# Patient Record
Sex: Female | Born: 1965 | Race: Black or African American | Hispanic: No | Marital: Married | Smoking: Never smoker
Health system: Southern US, Community
[De-identification: ages and names within clinical notes are randomized; demographics above are authoritative.]

## PROBLEM LIST (undated history)

## (undated) DIAGNOSIS — I10 Essential (primary) hypertension: Secondary | ICD-10-CM

## (undated) DIAGNOSIS — F32A Depression, unspecified: Secondary | ICD-10-CM

## (undated) DIAGNOSIS — A749 Chlamydial infection, unspecified: Secondary | ICD-10-CM

## (undated) DIAGNOSIS — F419 Anxiety disorder, unspecified: Secondary | ICD-10-CM

## (undated) DIAGNOSIS — D649 Anemia, unspecified: Secondary | ICD-10-CM

## (undated) DIAGNOSIS — K589 Irritable bowel syndrome without diarrhea: Secondary | ICD-10-CM

## (undated) DIAGNOSIS — F329 Major depressive disorder, single episode, unspecified: Secondary | ICD-10-CM

## (undated) DIAGNOSIS — T7840XA Allergy, unspecified, initial encounter: Secondary | ICD-10-CM

## (undated) HISTORY — DX: Major depressive disorder, single episode, unspecified: F32.9

## (undated) HISTORY — DX: Depression, unspecified: F32.A

## (undated) HISTORY — PX: TUBAL LIGATION: SHX77

## (undated) HISTORY — DX: Allergy, unspecified, initial encounter: T78.40XA

## (undated) HISTORY — PX: TONSILLECTOMY: SUR1361

## (undated) HISTORY — DX: Anxiety disorder, unspecified: F41.9

## (undated) HISTORY — DX: Essential (primary) hypertension: I10

---

## 2005-01-06 ENCOUNTER — Emergency Department (HOSPITAL_COMMUNITY): Admission: EM | Admit: 2005-01-06 | Discharge: 2005-01-07 | Payer: Self-pay | Admitting: Emergency Medicine

## 2005-04-19 ENCOUNTER — Ambulatory Visit (HOSPITAL_COMMUNITY): Admission: RE | Admit: 2005-04-19 | Discharge: 2005-04-19 | Payer: Self-pay | Admitting: *Deleted

## 2005-06-08 ENCOUNTER — Inpatient Hospital Stay (HOSPITAL_COMMUNITY): Admission: AD | Admit: 2005-06-08 | Discharge: 2005-06-08 | Payer: Self-pay | Admitting: Family Medicine

## 2005-06-08 ENCOUNTER — Ambulatory Visit: Payer: Self-pay | Admitting: Obstetrics and Gynecology

## 2005-07-09 ENCOUNTER — Inpatient Hospital Stay (HOSPITAL_COMMUNITY): Admission: AD | Admit: 2005-07-09 | Discharge: 2005-07-09 | Payer: Self-pay | Admitting: Obstetrics and Gynecology

## 2005-09-20 ENCOUNTER — Inpatient Hospital Stay (HOSPITAL_COMMUNITY): Admission: AD | Admit: 2005-09-20 | Discharge: 2005-09-23 | Payer: Self-pay | Admitting: Obstetrics and Gynecology

## 2005-09-20 ENCOUNTER — Encounter (INDEPENDENT_AMBULATORY_CARE_PROVIDER_SITE_OTHER): Payer: Self-pay | Admitting: Specialist

## 2005-09-24 ENCOUNTER — Encounter: Admission: RE | Admit: 2005-09-24 | Discharge: 2005-10-23 | Payer: Self-pay | Admitting: Obstetrics and Gynecology

## 2005-09-25 ENCOUNTER — Inpatient Hospital Stay (HOSPITAL_COMMUNITY): Admission: AD | Admit: 2005-09-25 | Discharge: 2005-09-25 | Payer: Self-pay | Admitting: Obstetrics and Gynecology

## 2005-09-27 ENCOUNTER — Inpatient Hospital Stay (HOSPITAL_COMMUNITY): Admission: AD | Admit: 2005-09-27 | Discharge: 2005-09-27 | Payer: Self-pay | Admitting: Obstetrics and Gynecology

## 2005-10-24 ENCOUNTER — Encounter: Admission: RE | Admit: 2005-10-24 | Discharge: 2005-11-03 | Payer: Self-pay | Admitting: Obstetrics and Gynecology

## 2005-11-04 ENCOUNTER — Other Ambulatory Visit: Admission: RE | Admit: 2005-11-04 | Discharge: 2005-11-04 | Payer: Self-pay | Admitting: Obstetrics and Gynecology

## 2006-05-10 ENCOUNTER — Ambulatory Visit: Payer: Self-pay | Admitting: Psychiatry

## 2006-05-10 ENCOUNTER — Other Ambulatory Visit (HOSPITAL_COMMUNITY): Admission: RE | Admit: 2006-05-10 | Discharge: 2006-08-08 | Payer: Self-pay | Admitting: Psychiatry

## 2007-03-17 ENCOUNTER — Ambulatory Visit: Payer: Self-pay | Admitting: Psychiatry

## 2007-03-17 ENCOUNTER — Other Ambulatory Visit (HOSPITAL_COMMUNITY): Admission: RE | Admit: 2007-03-17 | Discharge: 2007-06-15 | Payer: Self-pay | Admitting: Psychiatry

## 2009-03-26 ENCOUNTER — Emergency Department (HOSPITAL_BASED_OUTPATIENT_CLINIC_OR_DEPARTMENT_OTHER): Admission: EM | Admit: 2009-03-26 | Discharge: 2009-03-27 | Payer: Self-pay | Admitting: Emergency Medicine

## 2009-03-27 ENCOUNTER — Ambulatory Visit: Payer: Self-pay | Admitting: Diagnostic Radiology

## 2009-12-04 DIAGNOSIS — G43909 Migraine, unspecified, not intractable, without status migrainosus: Secondary | ICD-10-CM | POA: Insufficient documentation

## 2010-06-30 LAB — COMPREHENSIVE METABOLIC PANEL
ALT: 19 U/L (ref 0–35)
AST: 25 U/L (ref 0–37)
Albumin: 4.2 g/dL (ref 3.5–5.2)
Alkaline Phosphatase: 128 U/L — ABNORMAL HIGH (ref 39–117)
BUN: 9 mg/dL (ref 6–23)
CO2: 28 mEq/L (ref 19–32)
Calcium: 9.2 mg/dL (ref 8.4–10.5)
Chloride: 103 mEq/L (ref 96–112)
Creatinine, Ser: 0.9 mg/dL (ref 0.4–1.2)
GFR calc Af Amer: >60 mL/min (ref >60)
GFR calc non Af Amer: >60 mL/min (ref >60)
Glucose, Bld: 88 mg/dL (ref 70–99)
Potassium: 3.7 mEq/L (ref 3.5–5.1)
Sodium: 142 mEq/L (ref 135–145)
Total Bilirubin: 0.5 mg/dL (ref 0.3–1.2)
Total Protein: 7.9 g/dL (ref 6.0–8.3)

## 2010-06-30 LAB — CBC
HCT: 34.6 % — ABNORMAL LOW (ref 36.0–46.0)
Hemoglobin: 11.7 g/dL — ABNORMAL LOW (ref 12.0–15.0)
MCHC: 33.7 g/dL (ref 30.0–36.0)
MCV: 84 fL (ref 78.0–100.0)
Platelets: ADEQUATE 10*3/uL (ref 150–400)
RBC: 4.12 MIL/uL (ref 3.87–5.11)
RDW: 18 % — ABNORMAL HIGH (ref 11.5–15.5)
WBC: 13.6 10*3/uL — ABNORMAL HIGH (ref 4.0–10.5)

## 2010-06-30 LAB — URINE CULTURE: Colony Count: 60000

## 2010-06-30 LAB — URINALYSIS, ROUTINE W REFLEX MICROSCOPIC
Bilirubin Urine: NEGATIVE
Glucose, UA: NEGATIVE mg/dL
Ketones, ur: 15 mg/dL — AB
Nitrite: NEGATIVE
Protein, ur: NEGATIVE mg/dL
Specific Gravity, Urine: 1.027 (ref 1.005–1.030)
Urobilinogen, UA: 0.2 mg/dL (ref 0.0–1.0)
pH: 5.5 (ref 5.0–8.0)

## 2010-06-30 LAB — URINE MICROSCOPIC-ADD ON

## 2010-06-30 LAB — DIFFERENTIAL
Basophils Absolute: 0.3 10*3/uL — ABNORMAL HIGH (ref 0.0–0.1)
Basophils Relative: 2 % — ABNORMAL HIGH (ref 0–1)
Eosinophils Absolute: 0.1 10*3/uL (ref 0.0–0.7)
Eosinophils Relative: 1 % (ref 0–5)
Lymphocytes Relative: 31 % (ref 12–46)
Lymphs Abs: 4.2 10*3/uL — ABNORMAL HIGH (ref 0.7–4.0)
Monocytes Absolute: 0.7 10*3/uL (ref 0.1–1.0)
Monocytes Relative: 5 % (ref 3–12)
Neutro Abs: 8.3 10*3/uL — ABNORMAL HIGH (ref 1.7–7.7)
Neutrophils Relative %: 61 % (ref 43–77)

## 2010-06-30 LAB — LIPASE, BLOOD: Lipase: 49 U/L (ref 23–300)

## 2010-06-30 LAB — PREGNANCY, URINE: Preg Test, Ur: NEGATIVE

## 2010-08-15 NOTE — Op Note (Signed)
[name], [name] NO.:  [id number]   MEDICAL RECORD NO.:  [id number]          PATIENT TYPE:  INP   LOCATION:  9128                          FACILITY:  [facility]   PHYSICIAN:  Osborn Coho, M.D.   DATE OF BIRTH:  1965/04/02   DATE OF PROCEDURE:  09/20/2005  DATE OF DISCHARGE:                                 OPERATIVE REPORT   PREOPERATIVE DIAGNOSES:  1.  Term intrauterine pregnancy.  2.  Elective repeat cesarean section.  3.  Spontaneous rupture of membranes, in labor.  4.  Desires permanent sterilization.   POSTOPERATIVE DIAGNOSES:  1.  Term intrauterine pregnancy.  2.  Elective repeat cesarean section.  3.  Spontaneous rupture of membranes, in labor.  4.  Desires permanent sterilization.   PROCEDURE:  1.  Repeat cesarean section.  2.  Bilateral tubal ligation.   ATTENDING PHYSICIAN:  Osborn Coho, M.D.   ASSISTANT:  Rica Koyanagi, C.N.M.   ANESTHESIA:  Spinal.   FLUIDS:  2600 mL.   URINE OUTPUT:  100 mL.   EBL:  500 mL.   SPECIMENS:  Placenta to pathology as well as bilateral portions of fallopian  tubes.   COMPLICATIONS:  None.   FINDINGS:  1.  Live female infant with Apgars of 9 at 1 minute and 9 at 5 minutes.  2.  Normal appearing bilateral ovaries and fallopian tubes.   PROCEDURE:  The patient was taken to the operating room after the consent  signed and witnessed.  Patient was given a spinal per Anesthesia and prepped  and draped in the normal sterile fashion.  A Pfannenstiel skin incision was  made and carried down to the underlying layer of fascia with the Bovie.  The  fascia was excised bilaterally in the midline and the incision extended  bilaterally with the Mayo scissors.  Kocher clamps were placed on the  inferior aspect of the fascial incision and the rectus muscle excised from  the fascia.  The same was done on the superior aspect of the fascial  incision.  The rectus muscle was separated in the midline and the  peritoneum  entered bluntly.  The bladder blade was placed and bladder flap created with  the Metzenbaum scissors.  A uterine incision was made with the scalpel and  extended bilaterally with the bandage scissors.  Infant was in direct  occiput posterior presentation and delivered without difficulty and oral and  nasopharynx were bulb suctioned.  Cord clamped and cut and infant handed to  the waiting pediatricians while 1g of Ancef being administered.  Cord bloods  were collected.  Placenta was removed via fundal massage and uterus cleared  of all clots and debris.  The uterus incision was repaired with #0 Vicryl in  a running locked fashion and a second imbricating layer was performed.  The  adnexa were identified bilaterally and noted to be within normal limits and  bilateral tubal ligation performed by placing a Babcock on the isthmic  portion of the right fallopian tube, which was then ligated with #0 plain  ties.  This was done twice and the  portion of the fallopian tube was excised  and the remaining pedicle cauterized with the Bovie.  The same was done on  the left fallopian tube.  Specimen sent to pathology.  Uterus had been  exteriorized for a bilateral tubal ligation and was returned to the  intraabdominal cavity.  Copious irrigation was performed and good hemostasis  of the uterine incision was noted.  The peritoneum was closed with #2-0  chromic in a running fashion and the fascia was repaired with #0 Vicryl in a  running fashion.  The subcutaneous tissue was reapproximated using #2-0  plain via three interrupteds and the skin was reapproximated using #3-0  Monocryl via a subcuticular stitch.  A pressure dressing was applied.  Sponge, lap and needle count was correct.  The patient tolerated the  procedure well and is currently awaiting transfer to the recovery room in  good condition.      Osborn Coho, M.D.  Electronically Signed     AR/MEDQ  D:  09/20/2005  T:   09/20/2005  Job:  [id number]
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
## 2011-07-08 ENCOUNTER — Ambulatory Visit (INDEPENDENT_AMBULATORY_CARE_PROVIDER_SITE_OTHER): Payer: Managed Care, Other (non HMO) | Admitting: Family Medicine

## 2011-07-08 VITALS — BP 140/80 | HR 47 | Temp 98.5°F | Resp 16 | Ht 66.0 in | Wt 226.6 lb

## 2011-07-08 DIAGNOSIS — Z0000 Encounter for general adult medical examination without abnormal findings: Secondary | ICD-10-CM

## 2011-07-08 DIAGNOSIS — IMO0001 Reserved for inherently not codable concepts without codable children: Secondary | ICD-10-CM

## 2011-07-08 DIAGNOSIS — Z23 Encounter for immunization: Secondary | ICD-10-CM

## 2011-07-08 DIAGNOSIS — H6690 Otitis media, unspecified, unspecified ear: Secondary | ICD-10-CM

## 2011-07-08 DIAGNOSIS — H9209 Otalgia, unspecified ear: Secondary | ICD-10-CM

## 2011-07-08 DIAGNOSIS — H9201 Otalgia, right ear: Secondary | ICD-10-CM

## 2011-07-08 DIAGNOSIS — R030 Elevated blood-pressure reading, without diagnosis of hypertension: Secondary | ICD-10-CM

## 2011-07-08 MED ORDER — FLUTICASONE PROPIONATE 50 MCG/ACT NA SUSP: 2.0000 | Freq: Every day | NASAL | Status: DC

## 2011-07-08 MED ORDER — KETOROLAC TROMETHAMINE 60 MG/2ML IM SOLN
60.0000 mg | Freq: Once | INTRAMUSCULAR | Status: AC
  Administered 2011-07-08: 60 mg via INTRAMUSCULAR

## 2011-07-08 MED ORDER — AMOXICILLIN 875 MG PO TABS: 875.0000 mg | ORAL_TABLET | Freq: Two times a day (BID) | ORAL | Status: AC

## 2011-07-08 NOTE — Progress Notes (Signed)
  Subjective:    Patient ID: [name], female    DOB: 02-13-1966, 46 y.o.   MRN: [id number]  HPI  Patient presents for CPE  1) Severe (R) otalgia X 1 day; no otic discharge. Significant allergies   2) Multiple elevated BP readings; strong FH of HTN  Review of Systems    See pink form Objective:   Physical Exam  Constitutional: She is oriented to person, place, and time. Vital signs are normal.  HENT:  Right Ear: Tympanic membrane is injected (bulging).  Cardiovascular: Normal rate, regular rhythm and normal pulses.   Pulmonary/Chest: Effort normal and breath sounds normal.  Abdominal: Soft. Normal appearance and bowel sounds are normal. She exhibits no mass. There is no hepatosplenomegaly.  Musculoskeletal: Normal range of motion.  Neurological: She is alert and oriented to person, place, and time. She displays normal reflexes. No cranial nerve deficit.  Skin: Skin is warm.  Psychiatric: She has a normal mood and affect.     On menses unable to obtain pap    Assessment & Plan:   1. Routine general medical examination at a health care facility  CBC with Differential, Comprehensive metabolic panel, TSH, Lipid panel, Tdap vaccine greater than or equal to 7yo IM, TB Skin Test  2. OM (otitis media), acute  amoxicillin (AMOXIL) 875 MG tablet  3. Otalgia of right ear  ketorolac (TORADOL) injection 60 mg  4. Elevated BP  Monitor BP readings  5. Allergic rhinitis  fluticasone (FLONASE) 50 MCG/ACT nasal spray

## 2011-07-08 NOTE — Progress Notes (Signed)
PPD Placement note [name], 46 y.o. female is here today for placement of PPD test Reason for PPD test: employment Pt taken PPD test before: yes Verified in allergy area and with patient that they are not allergic to the products PPD is made of (Phenol or Tween). No:  Is patient taking any oral or IV steroid medication now or have they taken it in the last month? no Has the patient ever received the BCG vaccine?: no Has the patient been in recent contact with anyone known or suspected of having active TB disease?: no      Date of exposure (if applicable):       Name of person they were exposed to (if applicable):  Patient's Country of origin?:  P:  PPD placed on 07/08/2011.  Patient advised to return for reading within 48-72 hours.

## 2011-07-10 ENCOUNTER — Ambulatory Visit (INDEPENDENT_AMBULATORY_CARE_PROVIDER_SITE_OTHER): Payer: Managed Care, Other (non HMO) | Admitting: *Deleted

## 2011-07-10 DIAGNOSIS — Z111 Encounter for screening for respiratory tuberculosis: Secondary | ICD-10-CM

## 2011-07-10 LAB — TB SKIN TEST: Induration: 0

## 2011-07-16 ENCOUNTER — Ambulatory Visit (INDEPENDENT_AMBULATORY_CARE_PROVIDER_SITE_OTHER): Payer: Managed Care, Other (non HMO) | Admitting: Family Medicine

## 2011-07-16 VITALS — BP 139/83 | HR 45 | Temp 98.1°F | Resp 14 | Ht 66.0 in | Wt 222.6 lb

## 2011-07-16 DIAGNOSIS — H6980 Other specified disorders of Eustachian tube, unspecified ear: Secondary | ICD-10-CM

## 2011-07-16 DIAGNOSIS — I200 Unstable angina: Secondary | ICD-10-CM

## 2011-07-16 MED ORDER — PREDNISONE 20 MG PO TABS: 20.0000 mg | ORAL_TABLET | Freq: Every day | ORAL | Status: AC

## 2011-07-16 NOTE — Progress Notes (Signed)
  Subjective:    Patient ID: [name], female    DOB: [birth year], 46 y.o.   MRN: [id number]  HPI Recent (R) OM- antibiotic day # 8(Amoxil) Otalgia has resolved however ear still feels clogged and hearing is reduced  Non smoker  Leaving for [facility] today.  Review of Systems     Objective:   Physical Exam  Constitutional: She appears well-developed.  HENT:  Right Ear: External ear and ear canal normal. Tympanic membrane is retracted.  Left Ear: External ear and ear canal normal.  Nose: Mucosal edema (swollen turbinates with purlple hue) present.  Neck: Neck supple.  Cardiovascular: Normal rate, regular rhythm and normal heart sounds.   Pulmonary/Chest: Effort normal and breath sounds normal.  Neurological: She is alert.  Skin: Skin is warm.       Assessment & Plan:   1. ETD (eustachian tube dysfunction)  predniSONE (DELTASONE) 20 MG tablet  Declines flonase Anticipatory guidance

## 2011-08-18 ENCOUNTER — Ambulatory Visit: Payer: Managed Care, Other (non HMO)

## 2011-08-18 ENCOUNTER — Ambulatory Visit (INDEPENDENT_AMBULATORY_CARE_PROVIDER_SITE_OTHER): Payer: Managed Care, Other (non HMO) | Admitting: Internal Medicine

## 2011-08-18 VITALS — BP 120/80 | HR 56 | Temp 98.0°F | Resp 16 | Ht 67.0 in | Wt 224.0 lb

## 2011-08-18 DIAGNOSIS — R05 Cough: Secondary | ICD-10-CM

## 2011-08-18 DIAGNOSIS — R5383 Other fatigue: Secondary | ICD-10-CM

## 2011-08-18 DIAGNOSIS — J209 Acute bronchitis, unspecified: Secondary | ICD-10-CM

## 2011-08-18 LAB — POCT CBC
Granulocyte percent: 61.2 %G (ref 37–80)
HCT, POC: 40.5 % (ref 37.7–47.9)
Hemoglobin: 12.7 g/dL (ref 12.2–16.2)
Lymph, poc: 4.2 — AB (ref 0.6–3.4)
MCH, POC: 28.3 pg (ref 27–31.2)
MCHC: 31.4 g/dL — AB (ref 31.8–35.4)
MCV: 90.3 fL (ref 80–97)
MID (cbc): 0.7 (ref 0–0.9)
MPV: 9.2 fL (ref 0–99.8)
POC Granulocyte: 7.7 — AB (ref 2–6.9)
POC LYMPH PERCENT: 33 %L (ref 10–50)
POC MID %: 5.8 %M (ref 0–12)
Platelet Count, POC: 380 10*3/uL (ref 142–424)
RDW, POC: 14.7 %
WBC: 12.6 10*3/uL — AB (ref 4.6–10.2)

## 2011-08-18 MED ORDER — HYDROCODONE-ACETAMINOPHEN 7.5-500 MG/15ML PO SOLN: 5.0000 mL | Freq: Four times a day (QID) | ORAL | Status: AC | PRN

## 2011-08-18 MED ORDER — AZITHROMYCIN 500 MG PO TABS: 500.0000 mg | ORAL_TABLET | Freq: Every day | ORAL | Status: AC

## 2011-08-18 NOTE — Patient Instructions (Signed)

## 2011-08-18 NOTE — Progress Notes (Signed)
  Subjective:    Patient ID: [name], female    DOB: 29-Oct-1965, 46 y.o.   MRN: [id number]  HPI Has fatigue, allergys, and cough. No fever, but hot and chills and sweats. No SOB, CP.   Review of Systems allergys    Objective:   Physical Exam  Constitutional: She is oriented to person, place, and time. She appears well-nourished. No distress.  HENT:  Right Ear: External ear normal.  Left Ear: External ear normal.  Nose: Mucosal edema, rhinorrhea and sinus tenderness present. Right sinus exhibits frontal sinus tenderness. Left sinus exhibits maxillary sinus tenderness and frontal sinus tenderness.  Mouth/Throat: Oropharynx is clear and moist.  Eyes: EOM are normal.  Neck: Neck supple.  Cardiovascular: Normal rate, regular rhythm and normal heart sounds.   Pulmonary/Chest: No respiratory distress. She has no decreased breath sounds. She has no wheezes. She has rhonchi. She has rales.         Rales at x  Neurological: She is alert and oriented to person, place, and time. Coordination normal.  Skin: Skin is warm and dry.  Psychiatric: She has a normal mood and affect.   [facility] reading (PRIMARY) by  Dr.Lakira Ogando cxr clear  CBC Results for orders placed in visit on 08/18/11  POCT CBC      Component Value Range   WBC 12.6 (*) 4.6 - 10.2 (K/uL)   Lymph, poc 4.2 (*) 0.6 - 3.4    POC LYMPH PERCENT 33.0  10 - 50 (%L)   MID (cbc) 0.7  0 - 0.9    POC MID % 5.8  0 - 12 (%M)   POC Granulocyte 7.7 (*) 2 - 6.9    Granulocyte percent 61.2  37 - 80 (%G)   RBC 4.48  4.04 - 5.48 (M/uL)   Hemoglobin 12.7  12.2 - 16.2 (g/dL)   HCT, POC 40.9  81.1 - 47.9 (%)   MCV 90.3  80 - 97 (fL)   MCH, POC 28.3  27 - 31.2 (pg)   MCHC 31.4 (*) 31.8 - 35.4 (g/dL)   RDW, POC 91.4     Platelet Count, POC 380  142 - 424 (K/uL)   MPV 9.2  0 - 99.8 (fL)         Assessment & Plan:  Bronchitis over 2 weeks Zithromax and lortab elixir

## 2012-07-11 ENCOUNTER — Inpatient Hospital Stay (HOSPITAL_COMMUNITY)
Admission: AD | Admit: 2012-07-11 | Discharge: 2012-07-11 | Disposition: A | Discharge status: Home / Self Care | Payer: 59 | Source: Ambulatory Visit | Attending: Obstetrics & Gynecology | Admitting: Obstetrics & Gynecology

## 2012-07-11 ENCOUNTER — Inpatient Hospital Stay (HOSPITAL_COMMUNITY): Payer: 59

## 2012-07-11 ENCOUNTER — Encounter (HOSPITAL_COMMUNITY): Payer: Self-pay | Admitting: *Deleted

## 2012-07-11 DIAGNOSIS — N949 Unspecified condition associated with female genital organs and menstrual cycle: Secondary | ICD-10-CM | POA: Insufficient documentation

## 2012-07-11 DIAGNOSIS — N938 Other specified abnormal uterine and vaginal bleeding: Secondary | ICD-10-CM | POA: Insufficient documentation

## 2012-07-11 DIAGNOSIS — R109 Unspecified abdominal pain: Secondary | ICD-10-CM | POA: Insufficient documentation

## 2012-07-11 DIAGNOSIS — A5901 Trichomonal vulvovaginitis: Secondary | ICD-10-CM | POA: Insufficient documentation

## 2012-07-11 HISTORY — DX: Chlamydial infection, unspecified: A74.9

## 2012-07-11 HISTORY — DX: Anemia, unspecified: D64.9

## 2012-07-11 HISTORY — DX: Irritable bowel syndrome without diarrhea: K58.9

## 2012-07-11 LAB — CBC
HCT: 33.8 % — ABNORMAL LOW (ref 36.0–46.0)
MCH: 29.7 pg (ref 26.0–34.0)
MCHC: 33.4 g/dL (ref 30.0–36.0)
MCV: 88.9 fL (ref 78.0–100.0)
Platelets: 283 10*3/uL (ref 150–400)
RBC: 3.8 MIL/uL — ABNORMAL LOW (ref 3.87–5.11)
WBC: 10.6 10*3/uL — ABNORMAL HIGH (ref 4.0–10.5)

## 2012-07-11 LAB — URINALYSIS, ROUTINE W REFLEX MICROSCOPIC
Bilirubin Urine: NEGATIVE
Ketones, ur: NEGATIVE mg/dL
Leukocytes, UA: NEGATIVE
Nitrite: NEGATIVE
Protein, ur: NEGATIVE mg/dL
Urobilinogen, UA: 0.2 mg/dL (ref 0.0–1.0)
pH: 5.5 (ref 5.0–8.0)

## 2012-07-11 LAB — WET PREP, GENITAL
WBC, Wet Prep HPF POC: NONE SEEN
Yeast Wet Prep HPF POC: NONE SEEN

## 2012-07-11 LAB — URINE MICROSCOPIC-ADD ON

## 2012-07-11 MED ORDER — METRONIDAZOLE 500 MG PO TABS
2000.0000 mg | ORAL_TABLET | Freq: Once | ORAL | Status: AC
  Administered 2012-07-11: 2000 mg via ORAL
  Filled 2012-07-11: qty 4

## 2012-07-11 NOTE — MAU Note (Signed)
Had period January 26, no period again until end of March - was regular, is bleeding again today.  Lower abd pain since January, nauseated @ times.

## 2012-07-11 NOTE — MAU Provider Note (Signed)
History     CSN: [id number]  Arrival date and time: 07/11/12 1735   None     Chief Complaint  Patient presents with  . Abdominal Pain  . Vaginal Bleeding   HPI 47 y.o. [id number] with irregular bleeding since December. Had period at end of December, no period January or February, had a period at end of March which was normal for her, then started spotting again today. Intermittent low abd pain since January, sometimes on right side, sometimes in rectum, random, not associated with bleeding.    Past Medical History  Diagnosis Date  . Anemia   . Chlamydia   . Irritable bowel syndrome (IBS)     Past Surgical History  Procedure Laterality Date  . Cesarean section    . Tonsillectomy    . Tubal ligation      History reviewed. No pertinent family history.  History  Substance Use Topics  . Smoking status: Never Smoker   . Smokeless tobacco: Not on file  . Alcohol Use: Yes     Comment: social alcohol    Allergies: No Known Allergies  Prescriptions prior to admission  Medication Sig Dispense Refill  . fluticasone (FLONASE) 50 MCG/ACT nasal spray Place 2 sprays into the nose daily.  1 g  6  . [DISCONTINUED] acetaminophen-codeine (TYLENOL #3) 300-30 MG per tablet Take 1 tablet by mouth every 4 (four) hours as needed.        Review of Systems  Constitutional: Negative.  Negative for fever.  Respiratory: Negative.   Cardiovascular: Negative.   Gastrointestinal: Positive for abdominal pain. Negative for nausea, vomiting, diarrhea and constipation.  Genitourinary: Negative for dysuria, urgency, frequency, hematuria and flank pain.       Positive for bleeding   Musculoskeletal: Negative.   Neurological: Negative.   Psychiatric/Behavioral: Negative.    Physical Exam   Blood pressure 143/68, pulse 58, temperature 98.8 F (37.1 C), temperature source Oral, resp. rate 18, last menstrual period 07/11/2012.  Physical Exam  Nursing note and vitals reviewed. Constitutional:  She is oriented to person, place, and time. She appears well-developed and well-nourished. No distress.  Cardiovascular: Normal rate.   Respiratory: Effort normal.  GI: Soft. There is no tenderness.  Genitourinary: Uterus is not tender. Cervix exhibits friability. Cervix exhibits no motion tenderness and no discharge. Right adnexum displays tenderness. Left adnexum displays no tenderness. There is bleeding (scant) around the vagina. Vaginal discharge (clear, malodorous) found.    Exam limited by body habitus   Musculoskeletal: Normal range of motion.  Neurological: She is alert and oriented to person, place, and time.  Skin: Skin is warm and dry.  Psychiatric: She has a normal mood and affect.    MAU Course  Procedures Results for orders placed during the hospital encounter of 07/11/12 (from the past 24 hour(s))  URINALYSIS, ROUTINE W REFLEX MICROSCOPIC     Status: Abnormal   Collection Time    07/11/12  5:45 PM      Result Value Range   Color, Urine YELLOW  YELLOW   APPearance CLEAR  CLEAR   Specific Gravity, Urine >1.030 (*) 1.005 - 1.030   pH 5.5  5.0 - 8.0   Glucose, UA NEGATIVE  NEGATIVE mg/dL   Hgb urine dipstick LARGE (*) NEGATIVE   Bilirubin Urine NEGATIVE  NEGATIVE   Ketones, ur NEGATIVE  NEGATIVE mg/dL   Protein, ur NEGATIVE  NEGATIVE mg/dL   Urobilinogen, UA 0.2  0.0 - 1.0 mg/dL   Nitrite NEGATIVE  NEGATIVE   Leukocytes, UA NEGATIVE  NEGATIVE  URINE MICROSCOPIC-ADD ON     Status: Abnormal   Collection Time    07/11/12  5:45 PM      Result Value Range   Squamous Epithelial / LPF FEW (*) RARE   WBC, UA 0-2  <3 WBC/hpf   RBC / HPF 11-20  <3 RBC/hpf   Bacteria, UA MANY (*) RARE   Urine-Other FEW YEAST    POCT PREGNANCY, URINE     Status: None   Collection Time    07/11/12  5:48 PM      Result Value Range   Preg Test, Ur NEGATIVE  NEGATIVE  CBC     Status: Abnormal   Collection Time    07/11/12  6:01 PM      Result Value Range   WBC 10.6 (*) 4.0 - 10.5 K/uL     RBC 3.80 (*) 3.87 - 5.11 MIL/uL   Hemoglobin 11.3 (*) 12.0 - 15.0 g/dL   HCT 40.9 (*) 81.1 - 91.4 %   MCV 88.9  78.0 - 100.0 fL   MCH 29.7  26.0 - 34.0 pg   MCHC 33.4  30.0 - 36.0 g/dL   RDW 78.2  95.6 - 21.3 %   Platelets 283  150 - 400 K/uL  WET PREP, GENITAL     Status: Abnormal   Collection Time    07/11/12  6:16 PM      Result Value Range   Yeast Wet Prep HPF POC NONE SEEN  NONE SEEN   Trich, Wet Prep FEW (*) NONE SEEN   Clue Cells Wet Prep HPF POC FEW (*) NONE SEEN   WBC, Wet Prep HPF POC NONE SEEN  NONE SEEN    US Transvaginal Non-ob  07/11/2012  *RADIOLOGY REPORT*  Clinical Data: Pelvic pain and right lower quadrant tenderness. Irregular menses.  TRANSABDOMINAL AND TRANSVAGINAL ULTRASOUND OF PELVIS Technique:  Both transabdominal and transvaginal ultrasound examinations of the pelvis were performed. Transabdominal technique was performed for global imaging of the pelvis including uterus, ovaries, adnexal regions, and pelvic cul-de-sac.  It was necessary to proceed with endovaginal exam following the transabdominal exam to visualize the uterus, endometrium, and ovaries.  Comparison:  CT scan of the abdomen and pelvis dated 03/27/2009  Findings:  Uterus: 7.9 x 4.9 x 6.2 cm.  1.4 cm fibroid in the posterior aspect of the body of the uterus.  1.3 cm fibroid in the fundus of the uterus.  Endometrium: 6 mm in thickness.  Normal.  Right ovary:  Normal.  1.8 x 1.1 x 2.0 cm.  Left ovary: 3.0 x 1.4 x 1.8 cm.  8 mm simple exophytic cyst.  Other findings: No free fluid  IMPRESSION: Two tiny fibroids in the uterus and a tiny simple cyst on the left ovary.  Otherwise, normal.   Original Report Authenticated By: Francene Boyers, M.D.    US Pelvis Complete  07/11/2012  *RADIOLOGY REPORT*  Clinical Data: Pelvic pain and right lower quadrant tenderness. Irregular menses.  TRANSABDOMINAL AND TRANSVAGINAL ULTRASOUND OF PELVIS Technique:  Both transabdominal and transvaginal ultrasound examinations of the  pelvis were performed. Transabdominal technique was performed for global imaging of the pelvis including uterus, ovaries, adnexal regions, and pelvic cul-de-sac.  It was necessary to proceed with endovaginal exam following the transabdominal exam to visualize the uterus, endometrium, and ovaries.  Comparison:  CT scan of the abdomen and pelvis dated 03/27/2009  Findings:  Uterus: 7.9 x 4.9 x 6.2 cm.  1.4 cm fibroid in the posterior aspect of the body of the uterus.  1.3 cm fibroid in the fundus of the uterus.  Endometrium: 6 mm in thickness.  Normal.  Right ovary:  Normal.  1.8 x 1.1 x 2.0 cm.  Left ovary: 3.0 x 1.4 x 1.8 cm.  8 mm simple exophytic cyst.  Other findings: No free fluid  IMPRESSION: Two tiny fibroids in the uterus and a tiny simple cyst on the left ovary.  Otherwise, normal.   Original Report Authenticated By: Francene Boyers, M.D.    Assessment and Plan  1. Trichomoniasis - given flagyl 2000 mg PO in MAU, rev'd partner treatment 2. DUB/pelvic pain - very small fibroids and left ovarian cyst on u/s, not likely be to cause of bleeding or pain, no heavy bleeding at this time, f/u in GYN clinic   [facility] 07/11/2012, 8:14 PM

## 2012-07-13 NOTE — MAU Provider Note (Signed)
Attestation of Attending Supervision of Advanced Practitioner (CNM/NP): Evaluation and management procedures were performed by the Advanced Practitioner under my supervision and collaboration. I have reviewed the Advanced Practitioner's note and chart, and I agree with the management and plan.  Harles Evetts H. 9:46 AM   

## 2012-08-08 ENCOUNTER — Encounter: Payer: 59 | Admitting: Obstetrics & Gynecology

## 2012-09-03 DIAGNOSIS — N840 Polyp of corpus uteri: Secondary | ICD-10-CM | POA: Insufficient documentation

## 2012-10-30 ENCOUNTER — Emergency Department (HOSPITAL_COMMUNITY)
Admission: EM | Admit: 2012-10-30 | Discharge: 2012-10-30 | Disposition: A | Discharge status: Home / Self Care | Payer: 59 | Attending: Emergency Medicine | Admitting: Emergency Medicine

## 2012-10-30 ENCOUNTER — Emergency Department (HOSPITAL_COMMUNITY): Payer: 59

## 2012-10-30 ENCOUNTER — Encounter (HOSPITAL_COMMUNITY): Payer: Self-pay

## 2012-10-30 DIAGNOSIS — R0789 Other chest pain: Secondary | ICD-10-CM

## 2012-10-30 DIAGNOSIS — Z8619 Personal history of other infectious and parasitic diseases: Secondary | ICD-10-CM | POA: Insufficient documentation

## 2012-10-30 DIAGNOSIS — M549 Dorsalgia, unspecified: Secondary | ICD-10-CM | POA: Insufficient documentation

## 2012-10-30 DIAGNOSIS — Z862 Personal history of diseases of the blood and blood-forming organs and certain disorders involving the immune mechanism: Secondary | ICD-10-CM | POA: Insufficient documentation

## 2012-10-30 DIAGNOSIS — R079 Chest pain, unspecified: Secondary | ICD-10-CM

## 2012-10-30 DIAGNOSIS — Z8719 Personal history of other diseases of the digestive system: Secondary | ICD-10-CM | POA: Insufficient documentation

## 2012-10-30 DIAGNOSIS — R071 Chest pain on breathing: Secondary | ICD-10-CM | POA: Insufficient documentation

## 2012-10-30 LAB — COMPREHENSIVE METABOLIC PANEL
Alkaline Phosphatase: 99 U/L (ref 39–117)
BUN: 7 mg/dL (ref 6–23)
Calcium: 8.6 mg/dL (ref 8.4–10.5)
Creatinine, Ser: 0.78 mg/dL (ref 0.50–1.10)
GFR calc Af Amer: >90 mL/min (ref >90)
Glucose, Bld: 98 mg/dL (ref 70–99)
Potassium: 4 mEq/L (ref 3.5–5.1)
Total Protein: 6.6 g/dL (ref 6.0–8.3)

## 2012-10-30 LAB — CBC
HCT: 34.7 % — ABNORMAL LOW (ref 36.0–46.0)
Hemoglobin: 12 g/dL (ref 12.0–15.0)
MCH: 30.2 pg (ref 26.0–34.0)
MCHC: 34.6 g/dL (ref 30.0–36.0)
MCV: 87.4 fL (ref 78.0–100.0)

## 2012-10-30 LAB — TROPONIN I: Troponin I: <0.3 ng/mL (ref <0.30)

## 2012-10-30 LAB — LIPASE, BLOOD: Lipase: 20 U/L (ref 11–59)

## 2012-10-30 MED ORDER — TRAMADOL HCL 50 MG PO TABS: 50.0000 mg | ORAL_TABLET | Freq: Four times a day (QID) | ORAL | Status: DC | PRN

## 2012-10-30 MED ORDER — GI COCKTAIL ~~LOC~~
30.0000 mL | Freq: Once | ORAL | Status: AC
  Administered 2012-10-30: 30 mL via ORAL
  Filled 2012-10-30: qty 30

## 2012-10-30 MED ORDER — HYDROCODONE-ACETAMINOPHEN 5-325 MG PO TABS
2.0000 | ORAL_TABLET | Freq: Once | ORAL | Status: AC
  Administered 2012-10-30: 2 via ORAL
  Filled 2012-10-30: qty 2

## 2012-10-30 MED ORDER — FAMOTIDINE 20 MG PO TABS
20.0000 mg | ORAL_TABLET | Freq: Once | ORAL | Status: AC
Start: 2012-10-30 — End: 2012-10-30
  Administered 2012-10-30: 20 mg via ORAL
  Filled 2012-10-30: qty 1

## 2012-10-30 NOTE — ED Notes (Signed)
Pt complains of back pain and chest pain, hx of same and was told her breast were too big

## 2012-10-30 NOTE — ED Notes (Signed)
PT ambulated with baseline gait; VSS; A&Ox3; no signs of distress; respirations even and unlabored; skin warm and dry; no questions upon discharge.  

## 2012-10-30 NOTE — ED Notes (Signed)
C/o left shoulder blade area pain since yesterday morning then epigastric pain started in the afternoon. Reports pain worse with deep breaths, mvmt & palpation. Denies n/v, diaphoresis, cold, cough, fever, chills. C/o SOB due to pain with deep breaths.

## 2012-10-30 NOTE — ED Provider Notes (Signed)
CSN: [id number]     Arrival date & time 10/30/12  0830 History     First MD Initiated Contact with Patient 10/30/12 [phone]     Chief Complaint  Patient presents with  . Chest Pain  . Back Pain   (Consider location/radiation/quality/duration/timing/severity/associated sxs/prior Treatment) Patient is a 47 y.o. female presenting with chest pain and back pain. The history is provided by the patient.  Chest Pain Associated symptoms: back pain   Associated symptoms: no abdominal pain, no cough, no fever, no headache, no nausea, no palpitations, no shortness of breath and not vomiting   Back Pain Associated symptoms: chest pain   Associated symptoms: no abdominal pain, no fever and no headaches   pt c/o pain mid to left back and chest onset yesterday.  Onset at rest. Pain constant, dull, moderate. Worse w certain movements, change of position, turning torso, palpation back/chest. No change whether upright or supine. No relation to activity or exertion. No associated sob, nv or diaphoresis. Denies unusual fatigue or doe. No other recent similar cp or discomfort. Denies recent chest or back strain. No trauma or fall. Pt denies personal hx chest/heart disease. No family hx heart disease. No cocaine use. Non smoker. No recent immobility, trauma, travel or surgery. No pleuritic pain. No leg pain or swelling. No hx dvt or pe. No hx gerd. Denies hx gallstones. No abd pain.      Past Medical History  Diagnosis Date  . Anemia   . Chlamydia   . Irritable bowel syndrome (IBS)    Past Surgical History  Procedure Laterality Date  . Cesarean section    . Tonsillectomy    . Tubal ligation     No family history on file. History  Substance Use Topics  . Smoking status: Never Smoker   . Smokeless tobacco: Not on file  . Alcohol Use: Yes     Comment: social alcohol   OB History   Grav Para Term Preterm Abortions TAB SAB Ect Mult Living   2 2 2       2      Review of Systems  Constitutional:  Negative for fever and chills.  HENT: Negative for neck pain.   Eyes: Negative for redness.  Respiratory: Negative for cough and shortness of breath.   Cardiovascular: Positive for chest pain. Negative for palpitations and leg swelling.  Gastrointestinal: Negative for nausea, vomiting and abdominal pain.  Genitourinary: Negative for flank pain.  Musculoskeletal: Positive for back pain.  Skin: Negative for rash.  Neurological: Negative for headaches.  Hematological: Does not bruise/bleed easily.  Psychiatric/Behavioral: Negative for confusion.    Allergies  Review of patient's allergies indicates no known allergies.  Home Medications   Current Outpatient Rx  Name  Route  Sig  Dispense  Refill  . EXPIRED: fluticasone (FLONASE) 50 MCG/ACT nasal spray   Nasal   Place 2 sprays into the nose daily.   1 g   6    BP 157/83  Pulse 62  Temp(Src) 98.2 F (36.8 C) (Oral)  Resp 20  Ht 5\' 7"  (1.702 m)  Wt 233 lb (105.688 kg)  BMI 36.48 kg/m2  SpO2 98% Physical Exam  Nursing note and vitals reviewed. Constitutional: She appears well-developed and well-nourished. No distress.  Eyes: Conjunctivae are normal. No scleral icterus.  Neck: Neck supple. No tracheal deviation present.  Cardiovascular: Normal rate, regular rhythm, normal heart sounds and intact distal pulses.  Exam reveals no gallop and no friction rub.   No  murmur heard. Pulmonary/Chest: Effort normal and breath sounds normal. No respiratory distress. She exhibits tenderness.  Abdominal: Soft. Normal appearance and bowel sounds are normal. She exhibits no distension and no mass. There is no tenderness. There is no rebound and no guarding.  obese  Genitourinary:  No cva tenderness  Musculoskeletal: She exhibits no edema.  Tl spine non tender, aligned no step off.  Right thoracic muscular tenderness.   Neurological: She is alert.  Skin: Skin is warm and dry. No rash noted.  No shingles/rash in area of pain  Psychiatric:  She has a normal mood and affect.    ED Course   Procedures (including critical care time)  Results for orders placed during the hospital encounter of 10/30/12  CBC      Result Value Range   WBC 9.9  4.0 - 10.5 K/uL   RBC 3.97  3.87 - 5.11 MIL/uL   Hemoglobin 12.0  12.0 - 15.0 g/dL   HCT 16.1 (*) 09.6 - 04.5 %   MCV 87.4  78.0 - 100.0 fL   MCH 30.2  26.0 - 34.0 pg   MCHC 34.6  30.0 - 36.0 g/dL   RDW 40.9  81.1 - 91.4 %   Platelets 279  150 - 400 K/uL  COMPREHENSIVE METABOLIC PANEL      Result Value Range   Sodium 137  135 - 145 mEq/L   Potassium 4.0  3.5 - 5.1 mEq/L   Chloride 103  96 - 112 mEq/L   CO2 25  19 - 32 mEq/L   Glucose, Bld 98  70 - 99 mg/dL   BUN 7  6 - 23 mg/dL   Creatinine, Ser 7.82  0.50 - 1.10 mg/dL   Calcium 8.6  8.4 - 95.6 mg/dL   Total Protein 6.6  6.0 - 8.3 g/dL   Albumin 3.1 (*) 3.5 - 5.2 g/dL   AST 15  0 - 37 U/L   ALT 11  0 - 35 U/L   Alkaline Phosphatase 99  39 - 117 U/L   Total Bilirubin 0.2 (*) 0.3 - 1.2 mg/dL   GFR calc non Af Amer >90  >90 mL/min   GFR calc Af Amer >90  >90 mL/min  LIPASE, BLOOD      Result Value Range   Lipase 20  11 - 59 U/L  TROPONIN I      Result Value Range   Troponin I <0.30  <0.30 ng/mL   Dg Chest 2 View  10/30/2012   *RADIOLOGY REPORT*  Clinical Data: Chest pain  CHEST - 2 VIEW  Comparison: Prior chest x-ray 08/18/2011  Findings: The lungs are well-aerated and free from pulmonary edema, focal airspace consolidation or pulmonary nodule.  Cardiac and mediastinal contours are within normal limits.  No pneumothorax, or pleural effusion. No acute osseous findings.  IMPRESSION:  No acute cardiopulmonary disease.   Original Report Authenticated By: Malachy Moan, M.D.      MDM  pts pain appears worse w movement, change in position, palpation back/chest wall.  vicodin po.  Reviewed nursing notes and prior charts for additional history.    Date: 10/30/2012  Rate: 54  Rhythm: sinus bradycardia  QRS Axis: normal   Intervals: normal  ST/T Wave abnormalities: nonspecific T wave changes  Conduction Disutrbances:none  Narrative Interpretation:   Old EKG Reviewed: none available  After symptoms present/constant, x 1 days time, trop neg.  Pt symptoms appear c/w musculoskeletal pain, and not consistent w ACS.  Recheck hr 64,  rr 16, pulse ox 99% ra, comfortable.   Pt appears stable for d/c.      Suzi Roots, MD 10/30/12 1017

## 2012-10-30 NOTE — ED Notes (Signed)
Patient transported to X-ray 

## 2014-01-29 ENCOUNTER — Encounter (HOSPITAL_COMMUNITY): Payer: Self-pay

## 2014-10-26 ENCOUNTER — Ambulatory Visit (INDEPENDENT_AMBULATORY_CARE_PROVIDER_SITE_OTHER): Payer: 59 | Admitting: Family Medicine

## 2014-10-26 VITALS — BP 138/88 | HR 56 | Temp 98.5°F | Resp 18 | Ht 66.0 in | Wt 233.8 lb

## 2014-10-26 DIAGNOSIS — R001 Bradycardia, unspecified: Secondary | ICD-10-CM

## 2014-10-26 DIAGNOSIS — R079 Chest pain, unspecified: Secondary | ICD-10-CM | POA: Diagnosis not present

## 2014-10-26 DIAGNOSIS — R42 Dizziness and giddiness: Secondary | ICD-10-CM | POA: Diagnosis not present

## 2014-10-26 DIAGNOSIS — R002 Palpitations: Secondary | ICD-10-CM

## 2014-10-26 LAB — POCT CBC
Granulocyte percent: 49 %G (ref 37–80)
HEMATOCRIT: 38.1 % (ref 37.7–47.9)
Hemoglobin: 12.3 g/dL (ref 12.2–16.2)
LYMPH, POC: 3.9 — AB (ref 0.6–3.4)
MCH, POC: 28.5 pg (ref 27–31.2)
MCHC: 32.4 g/dL (ref 31.8–35.4)
MCV: 88.1 fL (ref 80–97)
MID (CBC): 0.5 (ref 0–0.9)
MPV: 7.4 fL (ref 0–99.8)
POC Granulocyte: 4.3 (ref 2–6.9)
POC LYMPH %: 44.8 % (ref 10–50)
POC MID %: 6.2 % (ref 0–12)
Platelet Count, POC: 273 10*3/uL (ref 142–424)
RBC: 4.33 M/uL (ref 4.04–5.48)
RDW, POC: 15.6 %
WBC: 8.8 10*3/uL (ref 4.6–10.2)

## 2014-10-26 LAB — COMPLETE METABOLIC PANEL WITH GFR
ALT: 12 U/L (ref 6–29)
AST: 15 U/L (ref 10–35)
Albumin: 3.7 g/dL (ref 3.6–5.1)
Alkaline Phosphatase: 104 U/L (ref 33–115)
BILIRUBIN TOTAL: 0.4 mg/dL (ref 0.2–1.2)
BUN: 13 mg/dL (ref 7–25)
CALCIUM: 9.3 mg/dL (ref 8.6–10.2)
CO2: 29 mmol/L (ref 20–31)
Chloride: 106 mmol/L (ref 98–110)
Creat: 0.89 mg/dL (ref 0.50–1.10)
GFR, Est African American: 89 mL/min (ref >=60)
GFR, Est Non African American: 77 mL/min (ref >=60)
GLUCOSE: 90 mg/dL (ref 65–99)
Potassium: 4.7 mmol/L (ref 3.5–5.3)
Sodium: 141 mmol/L (ref 135–146)
Total Protein: 7 g/dL (ref 6.1–8.1)

## 2014-10-26 LAB — TSH: TSH: 1.101 u[IU]/mL (ref 0.350–4.500)

## 2014-10-26 LAB — TROPONIN I

## 2014-10-26 NOTE — Patient Instructions (Signed)
We will take a stat troponin just to be sure that you are not having any acute cardiac process.  We will contact you with those results, sooner if it is positive.   I would like you to start taking a baby aspirin.   We are referring you to a cardiologist, so please await this phone call. Please discontinue all use of these diet pills.  They contain too much caffeine, and can likely participate in your symptoms.

## 2014-10-26 NOTE — Progress Notes (Signed)
[facility] [address] [place] [address] [phone]  Date:  10/26/2014   Name:  [name]   DOB:  05-Nov-1965   MRN:  [id number]  PCP:  No PCP Per Patient    History of Present Illness:  [name] is a 49 y.o. female patient who presents to [facility] for chief complaint of chest pain, pre-syncopal episode.  Patient states that 2 weeks ago, while she was walking into her home, she felt a sharp left sternal pain.  She associates this with sob, secondary to the pain that radiates into her back.  She also endorses dizziness and mild nausea.  This lasted for several hours, was not associated with any true exertion.  She laid down with some relief of the pain, and took a controlled pain medication that she does not know the name of.  She states that the next day except for some nausea   Everything appeared to resolve, but then 1 week ago she was standing outside mid afternoon talking and felt as if she was going to pass out.  She was outside and felt overheated.  Noted sweating and that she felt incredibly fatigued.  She then sat down, and her symptoms resolved in minutes.  She had associated palpitations, but no chest pain, sob, or dizziness.    To note, patient has been taking 3 kinds of diet pills for 2 weeks: Lipozene, Metaboup, and Xtrim Anti-oxidant.   She has a hx of chest pain previously 2 years ago, dx to be MSK.  Patient has very large breast, wears under wire bras, and has considered surgical breast reduction.  She is reluctant due to post-procedure pain with past operations .   Patient does not hydrate well, with 1 bottle of water per day.  She has hx of anemia.  No known history of heart trouble, she has never smoked   There are no active problems to display for this patient.   Past Medical History  Diagnosis Date  . Anemia   . Chlamydia   . Irritable bowel syndrome (IBS)   . Allergy   . Anxiety   . Depression     Past Surgical History   Procedure Laterality Date  . Cesarean section    . Tonsillectomy    . Tubal ligation      History  Substance Use Topics  . Smoking status: Never Smoker   . Smokeless tobacco: Not on file  . Alcohol Use: Yes     Comment: social alcohol    Family History  Problem Relation Age of Onset  . Heart disease Mother   . Hyperlipidemia Mother   . Hypertension Mother   . Mental illness Mother   . Diabetes Father   . Hyperlipidemia Father   . Hypertension Father   . Diabetes Maternal Grandmother   . Heart disease Maternal Grandmother   . Hyperlipidemia Maternal Grandmother   . Hypertension Maternal Grandmother   . Stroke Maternal Grandmother   . Heart disease Maternal Grandfather   . Stroke Maternal Grandfather     No Known Allergies  Medication list has been reviewed and updated.  Current Outpatient Prescriptions on File Prior to Visit  Medication Sig Dispense Refill  . traMADol (ULTRAM) 50 MG tablet Take 1 tablet (50 mg total) by mouth every 6 (six) hours as needed for pain. (Patient not taking: Reported on 10/26/2014) 20 tablet 0   No current facility-administered medications on file prior to visit.  ROS ROS otherwise unremarkable unless listed above.    Physical Examination: BP 138/88 mmHg  Pulse 56  Temp(Src) 98.5 F (36.9 C) (Oral)  Resp 18  Ht 5\' 6"  (1.676 m)  Wt 233 lb 12.8 oz (106.051 kg)  BMI 37.75 kg/m2  SpO2 99%  LMP 08/07/2014 Ideal Body Weight: Weight in (lb) to have BMI = 25: 154.6  Physical Exam  Constitutional: She appears well-developed and well-nourished. No distress.  HENT:  Head: Normocephalic and atraumatic.  Eyes: EOM are normal. Pupils are equal, round, and reactive to light. Right eye exhibits no discharge. Left eye exhibits no discharge. No scleral icterus.  Neck: Normal range of motion. No thyroid mass and no thyromegaly present.  Cardiovascular: Regular rhythm and intact distal pulses.  Bradycardia present.  Exam reveals no gallop, no  distant heart sounds and no friction rub.   No murmur heard. Pulses:      Radial pulses are 2+ on the right side, and 2+ on the left side.       Dorsalis pedis pulses are 2+ on the right side, and 2+ on the left side.       Posterior tibial pulses are 2+ on the right side, and 2+ on the left side.  Pulmonary/Chest: Effort normal and breath sounds normal. No respiratory distress. She has no wheezes.  Abdominal: Soft. Bowel sounds are normal. She exhibits no distension. There is no tenderness.  Musculoskeletal: Normal range of motion.  Lymphadenopathy:    She has no cervical adenopathy.  Neurological: She is alert. She has normal reflexes. She displays normal reflexes. No cranial nerve deficit. Coordination normal.  Skin: Skin is warm and dry. She is not diaphoretic. No erythema. No pallor.  Psychiatric: She has a normal mood and affect. Her behavior is normal.    Results for orders placed or performed in visit on 10/26/14  POCT CBC  Result Value Ref Range   WBC 8.8 4.6 - 10.2 K/uL   Lymph, poc 3.9 (A) 0.6 - 3.4   POC LYMPH PERCENT 44.8 10 - 50 %L   MID (cbc) 0.5 0 - 0.9   POC MID % 6.2 0 - 12 %M   POC Granulocyte 4.3 2 - 6.9   Granulocyte percent 49.0 37 - 80 %G   RBC 4.33 4.04 - 5.48 M/uL   Hemoglobin 12.3 12.2 - 16.2 g/dL   HCT, POC 16.1 09.6 - 47.9 %   MCV 88.1 80 - 97 fL   MCH, POC 28.5 27 - 31.2 pg   MCHC 32.4 31.8 - 35.4 g/dL   RDW, POC 04.5 %   Platelet Count, POC 273 142 - 424 K/uL   MPV 7.4 0 - 99.8 fL   EKG: sinus bradycardia, small and sometimes inverted T waves.  Very similar to EKG she had in 2014  Assessment and Plan: 49 year old female is here today for chief complaint of chest pain and possible pre-syncopal episode.  EKG normal, CBC unremarkable.  Will refer to cardiology given her non acute but abnormal EKG.  She is bradycardic at baseline.   Stat troponin was normal.  Advised to not engage in aerobic or strenuous activity at this time, pending consult.  Suspect  that she was be diagnosed with chest wall pain; maybe due to large breasts, she is thinking about reduction. Advised to ice chest 3-4x per day for 15 minutes.  Baby aspirin advised.  Advised to discontinue diet pills which may contribute to sxs (caffeine). 1. Chest pain,  unspecified chest pain type - POCT CBC - COMPLETE METABOLIC PANEL WITH GFR - TSH - EKG 12-Lead  2. Dizziness - POCT CBC - COMPLETE METABOLIC PANEL WITH GFR - TSH - EKG 12-Lead  3. Palpitations - EKG 12-Lead   Trena Platt, PA-C [facility] 10/26/2014 11:24 AM

## 2014-10-27 ENCOUNTER — Other Ambulatory Visit: Payer: Self-pay | Admitting: Physician Assistant

## 2014-10-27 DIAGNOSIS — Z1239 Encounter for other screening for malignant neoplasm of breast: Secondary | ICD-10-CM

## 2014-11-01 ENCOUNTER — Other Ambulatory Visit: Payer: Self-pay

## 2014-11-01 DIAGNOSIS — Z1231 Encounter for screening mammogram for malignant neoplasm of breast: Secondary | ICD-10-CM

## 2014-11-21 ENCOUNTER — Telehealth: Payer: Self-pay | Admitting: Physician Assistant

## 2014-11-21 NOTE — Telephone Encounter (Signed)
Patient was seen on 10/26/2014 for chest pain, dizziness, etc. Patient dropped off FMLA forms on 11/20/2014. I have already completed forms. Judeth Cornfield, can you review these forms and sign and date them? Please return to FMLA/Disabilities tray at checkout when completed. Forms placed in your box on 11/21/2014.   Thanks, [employer]

## 2014-11-22 NOTE — Progress Notes (Signed)
Cardiology Office Note   Date:  11/23/2014   ID:  Laurene, Melendrez 06-24-1965, MRN [id number]  PCP:  No PCP Per Patient  Cardiologist:   Madilyn Hook, MD   Chief Complaint  Patient presents with  . NP- CONSULT    patient complains of having chest paint 1-2 times per week  . Chest Pain      History of Present Illness: [name] is a 49 y.o. female who presents for an evaluation of chest pain.  Ms. Maple Hudson reports an episode of chest pain that occurred on 7/26.  It started while she was sitting at work and became more intense when she got up to walk into another episode.  The episode lasted for hours and were recurred intermittently several times over the next several days. It was associated with shortness of breath. The chest pain was located in the center of her chest and under her left breast. There is no radiation. It was sharp and severe in nature.  There was no nausea or diaphoresis.    Ms. Maple Hudson was evaluated by her PCP on 10/26/14.  ECG showed sinus bradycardia at 47 bpm and diffuse T wave flattening.  At that appointment CBC, TSH and BMP were unremarkable.   Of note Ms. Young had a similar episode of chest pain in 2014. At that time she was diagnosed with muscular spasms. She took some of the medication that she received a 2014 with this painful episode and the symptoms were alleviated.  Mrs. Young reports a sensation of possible acid reflux. She feels a burning sensation in her chest and throat. This is different from the pain she had on 7/26.  Her PCP prescribed Prilosec, though she has not yet started this medication. She does not like to take medicines possible.  Ms. Maple Hudson has been working on her diet. She is eating several fruits and vegetables daily and is no longer eating fried foods. She also drinks 5 bottles of water daily.   She plans to start exercising by walking and will increase this if her stress test results are normal.    Past Medical History    Diagnosis Date  . Anemia   . Chlamydia   . Irritable bowel syndrome (IBS)   . Allergy   . Anxiety   . Depression     Past Surgical History  Procedure Laterality Date  . Cesarean section    . Tonsillectomy    . Tubal ligation       Current Outpatient Prescriptions  Medication Sig Dispense Refill  . nitroGLYCERIN (NITROSTAT) 0.4 MG SL tablet Place 1 tablet (0.4 mg total) under the tongue every 5 (five) minutes as needed for chest pain. 25 tablet 3  . traMADol (ULTRAM) 50 MG tablet Take 1 tablet (50 mg total) by mouth every 6 (six) hours as needed for pain. (Patient not taking: Reported on 10/26/2014) 20 tablet 0   No current facility-administered medications for this visit.    Allergies:   Review of patient's allergies indicates no known allergies.    Social History:  The patient  reports that she has never smoked. She does not have any smokeless tobacco history on file. She reports that she drinks alcohol. She reports that she does not use illicit drugs.   Family History:  The patient's family history includes Diabetes in her father and maternal grandmother; Heart disease in her maternal grandfather, maternal grandmother, and mother; Hyperlipidemia in her father, maternal grandmother, and  mother; Hypertension in her father, maternal grandmother, and mother; Mental illness in her mother; Stroke in her maternal grandfather and maternal grandmother.    ROS:  Please see the history of present illness.   Otherwise, review of systems are positive for none.   All other systems are reviewed and negative.    PHYSICAL EXAM: VS:  BP 130/84 mmHg  Pulse 55  Ht 5\' 7"  (1.702 m)  Wt 107.911 kg (237 lb 14.4 oz)  BMI 37.25 kg/m2  LMP 08/07/2014 , BMI Body mass index is 37.25 kg/(m^2). GENERAL:  Well appearing HEENT:  Pupils equal round and reactive, fundi not visualized, oral mucosa unremarkable NECK:  No jugular venous distention, waveform within normal limits, carotid upstroke brisk and  symmetric, no bruits, no thyromegaly LYMPHATICS:  No cervical adenopathy LUNGS:  Clear to auscultation bilaterally HEART:  RRR.  PMI not displaced or sustained,S1 and S2 within normal limits, no S3, no S4, no clicks, no rubs, no murmurs ABD:  Flat, positive bowel sounds normal in frequency in pitch, no bruits, no rebound, no guarding, no midline pulsatile mass, no hepatomegaly, no splenomegaly EXT:  2 plus pulses throughout, no edema, no cyanosis no clubbing SKIN:  No rashes no nodules NEURO:  Cranial nerves II through XII grossly intact, motor grossly intact throughout PSYCH:  Cognitively intact, oriented to person place and time    EKG:  EKG is ordered today. The ekg ordered today demonstrates sinus bradycardia at 55 bpm.     Recent Labs: 10/26/2014: ALT 12; BUN 13; Creat 0.89; Hemoglobin 12.3; Potassium 4.7; Sodium 141; TSH 1.101    Lipid Panel No results found for: CHOL, TRIG, HDL, CHOLHDL, VLDL, LDLCALC, LDLDIRECT    Wt Readings from Last 3 Encounters:  11/23/14 107.911 kg (237 lb 14.4 oz)  10/26/14 106.051 kg (233 lb 12.8 oz)  10/30/12 105.688 kg (233 lb)      Other studies Reviewed: Additional studies/ records that were reviewed today include: medical record. Review of the above records demonstrates:  Please see elsewhere in the note.     ASSESSMENT AND PLAN:  # Atypical chest pain: Ms. Roxy Cedar chest pain is somewhat atyptical, however the fact that it was worse with walking is concerning.  Given her family history of coronary disease and SCD, will perform an evaluation for ischemia.  I agree with her PCP's decision to start a trial of PPI, as it seems that her symptoms could also be related to esophageal spasm and GERD. - treadmill exercise stress test - Nitroglycerin prn  # CV disease prevention: Check lipid panel.   Current medicines are reviewed at length with the patient today.  The patient does not have concerns regarding medicines.  The following changes  have been made:  Start nitroglycerin prn  Labs/ tests ordered today include: treadmill stress test  Orders Placed This Encounter  Procedures  . Lipid panel  . Exercise Tolerance Test  . EKG 12-Lead     Disposition:   FU with Dr. Elmarie Shiley C. Duke Salvia prn   Signed, Madilyn Hook, MD  11/23/2014 10:25 AM    [facility]

## 2014-11-23 ENCOUNTER — Encounter: Payer: Self-pay | Admitting: Cardiovascular Disease

## 2014-11-23 ENCOUNTER — Ambulatory Visit (INDEPENDENT_AMBULATORY_CARE_PROVIDER_SITE_OTHER): Payer: 59 | Admitting: Cardiovascular Disease

## 2014-11-23 ENCOUNTER — Other Ambulatory Visit: Payer: Self-pay | Admitting: Physician Assistant

## 2014-11-23 VITALS — BP 130/84 | HR 55 | Ht 67.0 in | Wt 237.9 lb

## 2014-11-23 DIAGNOSIS — R079 Chest pain, unspecified: Secondary | ICD-10-CM | POA: Insufficient documentation

## 2014-11-23 DIAGNOSIS — Z13220 Encounter for screening for lipoid disorders: Secondary | ICD-10-CM | POA: Diagnosis not present

## 2014-11-23 DIAGNOSIS — R072 Precordial pain: Secondary | ICD-10-CM

## 2014-11-23 DIAGNOSIS — R0789 Other chest pain: Secondary | ICD-10-CM | POA: Diagnosis not present

## 2014-11-23 DIAGNOSIS — K219 Gastro-esophageal reflux disease without esophagitis: Secondary | ICD-10-CM

## 2014-11-23 LAB — LIPID PANEL
Cholesterol: 182 mg/dL (ref 125–200)
HDL: 56 mg/dL (ref >=46)
LDL CALC: 111 mg/dL (ref <130)
Total CHOL/HDL Ratio: 3.3 Ratio (ref <=5.0)
Triglycerides: 76 mg/dL (ref <150)
VLDL: 15 mg/dL (ref <30)

## 2014-11-23 MED ORDER — OMEPRAZOLE 20 MG PO CPDR: 20.0000 mg | DELAYED_RELEASE_CAPSULE | Freq: Every day | ORAL | Status: DC

## 2014-11-23 MED ORDER — NITROGLYCERIN 0.4 MG SL SUBL: 0.4000 mg | SUBLINGUAL_TABLET | SUBLINGUAL | Status: DC | PRN

## 2014-11-23 NOTE — Patient Instructions (Signed)
Your physician has recommended you make the following change in your medication: a prescription has been sent to your pharmacy for nitroglycerin to take if needed. Follow the instructions on the bottle.  Your physician has requested that you have an exercise tolerance test. For further information please visit [email or web address] Please also follow instruction sheet, as given. You will be notified of these results by phone or mail.  Your physician recommends that you schedule a follow-up appointment AS NEEDED.

## 2014-11-23 NOTE — Telephone Encounter (Signed)
Patient is wondering if the paperwork has been faxed yet. She states that she was told it would be completed yesterday. Please call patient!

## 2014-11-24 NOTE — Telephone Encounter (Signed)
Contacted patient and discussed fmla form.  Modifications were made (please see form).  Heather had taken these forms (to copy and fax per Heather's instruction) and were possibly copied and given to patient as well.

## 2014-11-24 NOTE — Telephone Encounter (Signed)
Do we have a status update on these forms? Forms already completed by me, they need a signature from B[place]. Forms in box.   -Jasmine

## 2014-11-26 ENCOUNTER — Telehealth: Payer: Self-pay | Admitting: *Deleted

## 2014-11-26 NOTE — Telephone Encounter (Signed)
Spoke to patient. Result given . Verbalized understanding  

## 2014-11-26 NOTE — Telephone Encounter (Signed)
-----   Message from Chilton Si, MD sent at 11/26/2014  1:28 PM EDT ----- Lipid panel is normal.  No need to start aspirin or cholesterol medication at this time unless we find something on stress testing.

## 2014-11-26 NOTE — Telephone Encounter (Signed)
LEFT MESSAGE TO CALL BACK

## 2014-12-17 ENCOUNTER — Other Ambulatory Visit: Payer: Self-pay | Admitting: Physician Assistant

## 2014-12-17 ENCOUNTER — Ambulatory Visit
Admission: RE | Admit: 2014-12-17 | Discharge: 2014-12-17 | Disposition: A | Discharge status: Home / Self Care | Payer: 59 | Source: Ambulatory Visit | Attending: Physician Assistant | Admitting: Physician Assistant

## 2014-12-17 DIAGNOSIS — Z1231 Encounter for screening mammogram for malignant neoplasm of breast: Secondary | ICD-10-CM

## 2014-12-18 ENCOUNTER — Telehealth (HOSPITAL_COMMUNITY): Payer: Self-pay

## 2014-12-18 NOTE — Telephone Encounter (Signed)
Encounter complete. 

## 2014-12-20 ENCOUNTER — Ambulatory Visit (HOSPITAL_COMMUNITY)
Admission: RE | Admit: 2014-12-20 | Discharge: 2014-12-20 | Disposition: A | Discharge status: Home / Self Care | Payer: 59 | Source: Ambulatory Visit | Attending: Cardiology | Admitting: Cardiology

## 2014-12-20 DIAGNOSIS — R072 Precordial pain: Secondary | ICD-10-CM | POA: Diagnosis not present

## 2014-12-20 LAB — EXERCISE TOLERANCE TEST
CHL CUP RESTING HR STRESS: 57 {beats}/min
CHL CUP STRESS STAGE 1 HR: 61 {beats}/min
CHL CUP STRESS STAGE 1 SPEED: 0 mph
CHL CUP STRESS STAGE 2 GRADE: 0 %
CHL CUP STRESS STAGE 2 SPEED: 0.6 mph
CHL CUP STRESS STAGE 3 GRADE: 0 %
CHL CUP STRESS STAGE 5 SPEED: 2.5 mph
CHL CUP STRESS STAGE 6 GRADE: 14 %
CHL CUP STRESS STAGE 6 SPEED: 3.4 mph
CHL CUP STRESS STAGE 7 DBP: 124 mmHg
CHL CUP STRESS STAGE 7 HR: 148 {beats}/min
CHL CUP STRESS STAGE 8 HR: 88 {beats}/min
CSEPED: 7 min
CSEPEW: 8.5 METS
CSEPPHR: 173 {beats}/min
CSEPPMHR: 100 %
MPHR: 173 {beats}/min
Percent HR: 101 %
RPE: 16
Stage 1 DBP: 99 mmHg
Stage 1 Grade: 0 %
Stage 1 SBP: 123 mmHg
Stage 2 HR: 61 {beats}/min
Stage 3 HR: 60 {beats}/min
Stage 3 Speed: 1 mph
Stage 4 DBP: 90 mmHg
Stage 4 Grade: 10 %
Stage 4 HR: 121 {beats}/min
Stage 4 SBP: 153 mmHg
Stage 4 Speed: 1.7 mph
Stage 5 DBP: 88 mmHg
Stage 5 Grade: 12 %
Stage 5 HR: 153 {beats}/min
Stage 5 SBP: 138 mmHg
Stage 6 HR: 173 {beats}/min
Stage 7 Grade: 0 %
Stage 7 SBP: 213 mmHg
Stage 7 Speed: 0 mph
Stage 8 DBP: 84 mmHg
Stage 8 Grade: 0 %
Stage 8 SBP: 174 mmHg
Stage 8 Speed: 0 mph

## 2014-12-21 DIAGNOSIS — Z0271 Encounter for disability determination: Secondary | ICD-10-CM

## 2015-04-25 ENCOUNTER — Emergency Department (HOSPITAL_COMMUNITY): Payer: 59

## 2015-04-25 ENCOUNTER — Emergency Department (HOSPITAL_COMMUNITY)
Admission: EM | Admit: 2015-04-25 | Discharge: 2015-04-25 | Disposition: A | Discharge status: Home / Self Care | Payer: 59 | Attending: Emergency Medicine | Admitting: Emergency Medicine

## 2015-04-25 ENCOUNTER — Encounter (HOSPITAL_COMMUNITY): Payer: Self-pay | Admitting: Emergency Medicine

## 2015-04-25 DIAGNOSIS — Z8659 Personal history of other mental and behavioral disorders: Secondary | ICD-10-CM | POA: Insufficient documentation

## 2015-04-25 DIAGNOSIS — S3992XA Unspecified injury of lower back, initial encounter: Secondary | ICD-10-CM | POA: Diagnosis not present

## 2015-04-25 DIAGNOSIS — S199XXA Unspecified injury of neck, initial encounter: Secondary | ICD-10-CM | POA: Insufficient documentation

## 2015-04-25 DIAGNOSIS — Z8619 Personal history of other infectious and parasitic diseases: Secondary | ICD-10-CM | POA: Insufficient documentation

## 2015-04-25 DIAGNOSIS — Z8719 Personal history of other diseases of the digestive system: Secondary | ICD-10-CM | POA: Diagnosis not present

## 2015-04-25 DIAGNOSIS — Z862 Personal history of diseases of the blood and blood-forming organs and certain disorders involving the immune mechanism: Secondary | ICD-10-CM | POA: Diagnosis not present

## 2015-04-25 DIAGNOSIS — S8991XA Unspecified injury of right lower leg, initial encounter: Secondary | ICD-10-CM | POA: Insufficient documentation

## 2015-04-25 DIAGNOSIS — Z79899 Other long term (current) drug therapy: Secondary | ICD-10-CM | POA: Diagnosis not present

## 2015-04-25 DIAGNOSIS — Y9389 Activity, other specified: Secondary | ICD-10-CM | POA: Insufficient documentation

## 2015-04-25 DIAGNOSIS — M542 Cervicalgia: Secondary | ICD-10-CM

## 2015-04-25 DIAGNOSIS — M25561 Pain in right knee: Secondary | ICD-10-CM

## 2015-04-25 DIAGNOSIS — Y9289 Other specified places as the place of occurrence of the external cause: Secondary | ICD-10-CM | POA: Diagnosis not present

## 2015-04-25 DIAGNOSIS — Y998 Other external cause status: Secondary | ICD-10-CM | POA: Insufficient documentation

## 2015-04-25 DIAGNOSIS — S6991XA Unspecified injury of right wrist, hand and finger(s), initial encounter: Secondary | ICD-10-CM | POA: Insufficient documentation

## 2015-04-25 DIAGNOSIS — M25531 Pain in right wrist: Secondary | ICD-10-CM
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
## 2015-04-25 NOTE — Discharge Instructions (Signed)
Your x-rays today were normal.  You may take tylenol or motrin if needed for pain. Follow-up with your primary care physician. Return to the ED for new or worsening symptoms.  General Assault Assault includes any behavior or physical attack--whether it is on purpose or not--that results in injury to another person, damage to property, or both. This also includes assault that has not yet happened, but is planned to happen. Threats of assault may be physical, verbal, or written. They may be said or sent by:  Mail.  E-mail.  Text.  Social media.  Fax. The threats may be direct, implied, or understood. WHAT ARE THE DIFFERENT FORMS OF ASSAULT? Forms of assault include:  Physically assaulting a person. This includes physical threats to inflict physical harm as well as:  Slapping.  Hitting.  Poking.  Kicking.  Punching.  Pushing.  Sexually assaulting a person. Sexual assault is any sexual activity that a person is forced, threatened, or coerced to participate in. It may or may not involve physical contact with the person who is assaulting you. You are sexually assaulted if you are forced to have sexual contact of any kind.  Damaging or destroying a person's assistive equipment, such as glasses, canes, or walkers.  Throwing or hitting objects.  Using or displaying a weapon to harm or threaten someone.  Using or displaying an object that appears to be a weapon in a threatening manner.  Using greater physical size or strength to intimidate someone.  Making intimidating or threatening gestures.  Bullying.  Hazing.  Using language that is intimidating, threatening, hostile, or abusive.  Stalking.  Restraining someone with force. WHAT SHOULD I DO IF I EXPERIENCE ASSAULT?  Report assaults, threats, and stalking to the police. Call your local emergency services (911 in the [place] if you are in immediate danger or you need medical help.  You can work with a Clinical research associate or an  advocate to get legal protection against someone who has assaulted you or threatened you with assault. Protection includes restraining orders and private addresses. Crimes against you, such as assault, can also be prosecuted through the courts. Laws will vary depending on where you live.   This information is not intended to replace advice given to you by your health care provider. Make sure you discuss any questions you have with your health care provider.   Document Released: 03/16/2005 Document Revised: 04/06/2014 Document Reviewed: 12/01/2013 Elsevier Interactive Patient Education [employer]

## 2015-04-27 ENCOUNTER — Encounter (HOSPITAL_COMMUNITY): Payer: Self-pay | Admitting: *Deleted

## 2015-04-27 ENCOUNTER — Emergency Department (HOSPITAL_COMMUNITY)
Admission: EM | Admit: 2015-04-27 | Discharge: 2015-04-27 | Disposition: A | Discharge status: Home / Self Care | Payer: 59 | Attending: Emergency Medicine | Admitting: Emergency Medicine

## 2015-04-27 DIAGNOSIS — Z8719 Personal history of other diseases of the digestive system: Secondary | ICD-10-CM | POA: Diagnosis not present

## 2015-04-27 DIAGNOSIS — Z8619 Personal history of other infectious and parasitic diseases: Secondary | ICD-10-CM | POA: Diagnosis not present

## 2015-04-27 DIAGNOSIS — Z79899 Other long term (current) drug therapy: Secondary | ICD-10-CM | POA: Diagnosis not present

## 2015-04-27 DIAGNOSIS — Z8659 Personal history of other mental and behavioral disorders: Secondary | ICD-10-CM | POA: Diagnosis not present

## 2015-04-27 DIAGNOSIS — Z862 Personal history of diseases of the blood and blood-forming organs and certain disorders involving the immune mechanism: Secondary | ICD-10-CM | POA: Diagnosis not present

## 2015-04-27 DIAGNOSIS — S161XXD Strain of muscle, fascia and tendon at neck level, subsequent encounter: Secondary | ICD-10-CM | POA: Insufficient documentation

## 2015-04-27 DIAGNOSIS — M542 Cervicalgia: Secondary | ICD-10-CM | POA: Diagnosis present

## 2015-04-27 MED ORDER — METHOCARBAMOL 500 MG PO TABS: 500.0000 mg | ORAL_TABLET | Freq: Two times a day (BID) | ORAL | Status: DC

## 2015-04-27 MED ORDER — IBUPROFEN 600 MG PO TABS: 600.0000 mg | ORAL_TABLET | Freq: Four times a day (QID) | ORAL | Status: DC | PRN
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
## 2015-06-15 ENCOUNTER — Emergency Department (HOSPITAL_COMMUNITY)
Admission: EM | Admit: 2015-06-15 | Discharge: 2015-06-15 | Disposition: A | Discharge status: Home / Self Care | Payer: 59 | Attending: Emergency Medicine | Admitting: Emergency Medicine

## 2015-06-15 ENCOUNTER — Emergency Department (HOSPITAL_COMMUNITY): Payer: 59

## 2015-06-15 ENCOUNTER — Encounter (HOSPITAL_COMMUNITY): Payer: Self-pay | Admitting: Emergency Medicine

## 2015-06-15 DIAGNOSIS — Y9389 Activity, other specified: Secondary | ICD-10-CM | POA: Insufficient documentation

## 2015-06-15 DIAGNOSIS — S199XXA Unspecified injury of neck, initial encounter: Secondary | ICD-10-CM | POA: Diagnosis not present

## 2015-06-15 DIAGNOSIS — X58XXXA Exposure to other specified factors, initial encounter: Secondary | ICD-10-CM | POA: Insufficient documentation

## 2015-06-15 DIAGNOSIS — Z8659 Personal history of other mental and behavioral disorders: Secondary | ICD-10-CM | POA: Diagnosis not present

## 2015-06-15 DIAGNOSIS — Z8619 Personal history of other infectious and parasitic diseases: Secondary | ICD-10-CM | POA: Insufficient documentation

## 2015-06-15 DIAGNOSIS — Y998 Other external cause status: Secondary | ICD-10-CM | POA: Insufficient documentation

## 2015-06-15 DIAGNOSIS — Y9289 Other specified places as the place of occurrence of the external cause: Secondary | ICD-10-CM | POA: Diagnosis not present

## 2015-06-15 DIAGNOSIS — Z862 Personal history of diseases of the blood and blood-forming organs and certain disorders involving the immune mechanism: Secondary | ICD-10-CM | POA: Diagnosis not present

## 2015-06-15 DIAGNOSIS — S43402A Unspecified sprain of left shoulder joint, initial encounter: Secondary | ICD-10-CM | POA: Diagnosis not present

## 2015-06-15 DIAGNOSIS — S4992XA Unspecified injury of left shoulder and upper arm, initial encounter: Secondary | ICD-10-CM | POA: Diagnosis present

## 2015-06-15 MED ORDER — KETOROLAC TROMETHAMINE 30 MG/ML IJ SOLN
30.0000 mg | Freq: Once | INTRAMUSCULAR | Status: AC
  Administered 2015-06-15: 30 mg via INTRAMUSCULAR
  Filled 2015-06-15: qty 1

## 2015-06-15 MED ORDER — METHOCARBAMOL 500 MG PO TABS: 500.0000 mg | ORAL_TABLET | Freq: Two times a day (BID) | ORAL | Status: DC

## 2015-06-15 NOTE — Discharge Instructions (Signed)
Shoulder Sprain °A shoulder sprain is a partial or complete tear in one of the tough, fiber-like tissues (ligaments) in the shoulder. The ligaments in the shoulder help to hold the shoulder in place. °CAUSES °This condition may be caused by: °· A fall. °· A hit to the shoulder. °· A twist of the arm. °RISK FACTORS °This condition is more likely to develop in: °· People who play sports. °· People who have problems with balance or coordination. °SYMPTOMS °Symptoms of this condition include: °· Pain when moving the shoulder. °· Limited ability to move the shoulder. °· Swelling and tenderness on top of the shoulder. °· Warmth in the shoulder. °· A change in the shape of the shoulder. °· Redness or bruising on the shoulder. °DIAGNOSIS °This condition is diagnosed with a physical exam. During the exam, you may be asked to do simple exercises with your shoulder. You may also have imaging tests, such as X-rays, MRI, or a CT scan. These tests can show how severe the sprain is. °TREATMENT °This condition may be treated with: °· Rest. °· Pain medicine. °· Ice. °· A sling or brace. This is used to keep the arm still while the shoulder is healing. °· Physical therapy or rehabilitation exercises. These help to improve the range of motion and strength of the shoulder. °· Surgery (rare). Surgery may be needed if the sprain caused a joint to become unstable. Surgery may also be needed to reduce pain. °Some people may develop ongoing shoulder pain or lose some range of motion in the shoulder. However, most people do not develop long-term problems. °HOME CARE INSTRUCTIONS °· Rest. °· Take over-the-counter and prescription medicines only as told by your health care provider. °· If directed, apply ice to the area: °¨ Put ice in a plastic bag. °¨ Place a towel between your skin and the bag. °¨ Leave the ice on for 20 minutes, 2-3 times per day. °· If you were given a shoulder sling or brace: °¨ Wear it as told. °¨ Remove it to shower or  bathe. °¨ Move your arm only as much as told by your health care provider, but keep your hand moving to prevent swelling. °· If you were shown how to do any exercises, do them as told by your health care provider. °· Keep all follow-up visits as told by your health care provider. This is important. °SEEK MEDICAL CARE IF: °· Your pain gets worse. °· Your pain is not relieved with medicines. °· You have increased redness or swelling. °SEEK IMMEDIATE MEDICAL CARE IF: °· You have a fever. °· You cannot move your arm or shoulder. °· You develop numbness or tingling in your arms, hands, or fingers. °  °This information is not intended to replace advice given to you by your health care provider. Make sure you discuss any questions you have with your health care provider. °  °Document Released: 08/02/2008 Document Revised: 12/05/2014 Document Reviewed: 07/09/2014 °Elsevier Interactive Patient Education ©2016 [employer] ° °
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
## 2015-06-15 NOTE — ED Notes (Signed)
Heat pack applied to shoulder for comfort.
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
## 2016-01-18 ENCOUNTER — Encounter (HOSPITAL_COMMUNITY): Payer: Self-pay | Admitting: *Deleted

## 2016-01-18 ENCOUNTER — Emergency Department (HOSPITAL_COMMUNITY)
Admission: EM | Admit: 2016-01-18 | Discharge: 2016-01-18 | Disposition: A | Discharge status: Home / Self Care | Payer: Medicaid Other | Attending: Emergency Medicine | Admitting: Emergency Medicine

## 2016-01-18 ENCOUNTER — Emergency Department (HOSPITAL_COMMUNITY): Payer: Medicaid Other

## 2016-01-18 DIAGNOSIS — Y999 Unspecified external cause status: Secondary | ICD-10-CM | POA: Diagnosis not present

## 2016-01-18 DIAGNOSIS — S93602A Unspecified sprain of left foot, initial encounter: Secondary | ICD-10-CM | POA: Insufficient documentation

## 2016-01-18 DIAGNOSIS — W108XXA Fall (on) (from) other stairs and steps, initial encounter: Secondary | ICD-10-CM | POA: Diagnosis not present

## 2016-01-18 DIAGNOSIS — S99922A Unspecified injury of left foot, initial encounter: Secondary | ICD-10-CM | POA: Diagnosis present

## 2016-01-18 DIAGNOSIS — Y939 Activity, unspecified: Secondary | ICD-10-CM | POA: Insufficient documentation

## 2016-01-18 DIAGNOSIS — Y929 Unspecified place or not applicable: Secondary | ICD-10-CM | POA: Insufficient documentation

## 2016-01-18 MED ORDER — IBUPROFEN 800 MG PO TABS: 800.0000 mg | ORAL_TABLET | Freq: Three times a day (TID) | ORAL | 0 refills | Status: DC | PRN

## 2016-01-18 MED ORDER — TRAMADOL HCL 50 MG PO TABS: 50.0000 mg | ORAL_TABLET | Freq: Four times a day (QID) | ORAL | 0 refills | Status: DC | PRN

## 2016-01-18 NOTE — ED Provider Notes (Signed)
[facility]-EMERGENCY DEPT Provider Note   CSN: [id number] Arrival date & time: 01/18/16  1233  By signing my name below, I, Clovis PuAvnee Patel, attest that this documentation has been prepared under the direction and in the presence of  BoeingChris Keymani Mclean, PA-C. Electronically Signed: Clovis PuAvnee Patel, ED Scribe. 01/18/16. 2:10 PM.   History   Chief Complaint Chief Complaint  Patient presents with  . Fall  . Ankle Pain    The history is provided by the patient. No language interpreter was used.   HPI Comments:  [name] is a 50 y.o. female who presents to the Emergency Department complaining of sudden onset moderate L foot pain s/p a fall which occurred ~ 11 AM today. Pt states her shoe broke causing her to fall down the last 3-4 steps of a stairway. She notes associated mild hip and R ankle discomfort. Pt states her pain is exacerbated to the touch, with movement and with bearing weight to her L foot. She denies wrist pain and bruising. No alleviating factors noted. Pt denies any other complaints at this time.   Past Medical History:  Diagnosis Date  . Allergy   . Anemia   . Anxiety   . Chlamydia   . Depression   . Irritable bowel syndrome (IBS)     Patient Active Problem List   Diagnosis Date Noted  . Chest pain 11/23/2014    Past Surgical History:  Procedure Laterality Date  . CESAREAN SECTION    . TONSILLECTOMY    . TUBAL LIGATION      OB History    Gravida Para Term Preterm AB Living   2 2 2     2    SAB TAB Ectopic Multiple Live Births                   Home Medications    Prior to Admission medications   Medication Sig Start Date End Date Taking? Authorizing Provider  acetaminophen (TYLENOL) 500 MG tablet Take 1,000 mg by mouth every 6 (six) hours as needed for mild pain.    Historical Provider, MD  methocarbamol (ROBAXIN) 500 MG tablet Take 1 tablet (500 mg total) by mouth 2 (two) times daily. 06/15/15   Zadie Rhineonald Wickline, MD  nitroGLYCERIN (NITROSTAT) 0.4 MG SL  tablet Place 1 tablet (0.4 mg total) under the tongue every 5 (five) minutes as needed for chest pain. 11/23/14   Chilton Siiffany Wilson, MD    Family History Family History  Problem Relation Age of Onset  . Heart disease Mother   . Hyperlipidemia Mother   . Hypertension Mother   . Mental illness Mother   . Diabetes Father   . Hyperlipidemia Father   . Hypertension Father   . Diabetes Maternal Grandmother   . Heart disease Maternal Grandmother   . Hyperlipidemia Maternal Grandmother   . Hypertension Maternal Grandmother   . Stroke Maternal Grandmother   . Heart disease Maternal Grandfather   . Stroke Maternal Grandfather     Social History Social History  Substance Use Topics  . Smoking status: Never Smoker  . Smokeless tobacco: Not on file  . Alcohol use 0.0 oz/week     Comment: social alcohol     Allergies   Review of patient's allergies indicates no known allergies.   Review of Systems Review of Systems  Musculoskeletal: Positive for myalgias.  Skin: Negative for color change.     Physical Exam Updated Vital Signs BP 146/79 (BP Location: Left Arm)   Pulse Marland Kitchen(!)  55   Temp 97.8 F (36.6 C) (Oral)   Resp 18   SpO2 100%   Physical Exam  Constitutional: She is oriented to person, place, and time. She appears well-developed and well-nourished.  HENT:  Head: Normocephalic and atraumatic.  Eyes: Pupils are equal, round, and reactive to light.  Pulmonary/Chest: Effort normal.  Musculoskeletal:  Pain across top of L foot upon palpation. Decreased ROM due to pain.  Neurological: She is alert and oriented to person, place, and time.  Skin: Skin is warm and dry. Capillary refill takes less than 2 seconds.  Psychiatric: She has a normal mood and affect.  Nursing note and vitals reviewed.    ED Treatments / Results  DIAGNOSTIC STUDIES:  Oxygen Saturation is 100% on RA, normal by my interpretation.    COORDINATION OF CARE:  2:12 PM Discussed treatment plan with pt  at bedside and pt agreed to plan.  Labs (all labs ordered are listed, but only abnormal results are displayed) Labs Reviewed - No data to display  EKG  EKG Interpretation None       Radiology No results found.  Procedures Procedures (including critical care time)  Medications Ordered in ED Medications - No data to display   Initial Impression / Assessment and Plan / ED Course  I have reviewed the triage vital signs and the nursing notes.  Pertinent labs & imaging results that were available during my care of the patient were reviewed by me and considered in my medical decision making (see chart for details).  Clinical Course    Patient X-Ray negative for obvious fracture or dislocation.  Pt advised to follow up with orthopedics. Patient given Ace wrap and postop shoe while in ED, conservative therapy recommended and discussed. Patient will be discharged home & is agreeable with above plan. Returns precautions discussed. Pt appears safe for discharge.   Final Clinical Impressions(s) / ED Diagnoses   Final diagnoses:  None   Patient be referred to orthopedics.  Told to return here as needed New Prescriptions New Prescriptions   No medications on file  I personally performed the services described in this documentation, which was scribed in my presence. The recorded information has been reviewed and is accurate.    Charlestine Night, PA-C 01/18/16 1418    Jacalyn Lefevre, MD 01/19/16 [phone]

## 2016-01-18 NOTE — ED Triage Notes (Signed)
Pt reports falling down last two steps today due to her shoe breaking. Pt has left ankle and foot pain and right ankle pain.

## 2016-01-18 NOTE — ED Notes (Signed)
Declined W/C at D/C and was escorted to lobby by RN. 

## 2016-01-18 NOTE — Discharge Instructions (Signed)
Ice and elevate your foot.  Return here as needed.  Follow-up with orthopedist as needed

## 2017-05-25 ENCOUNTER — Emergency Department (HOSPITAL_COMMUNITY): Payer: PRIVATE HEALTH INSURANCE

## 2017-05-25 ENCOUNTER — Encounter (HOSPITAL_COMMUNITY): Payer: Self-pay

## 2017-05-25 ENCOUNTER — Emergency Department (HOSPITAL_COMMUNITY)
Admission: EM | Admit: 2017-05-25 | Discharge: 2017-05-26 | Disposition: A | Discharge status: Home / Self Care | Payer: PRIVATE HEALTH INSURANCE | Attending: Emergency Medicine | Admitting: Emergency Medicine

## 2017-05-25 DIAGNOSIS — Y929 Unspecified place or not applicable: Secondary | ICD-10-CM | POA: Diagnosis not present

## 2017-05-25 DIAGNOSIS — S022XXA Fracture of nasal bones, initial encounter for closed fracture: Secondary | ICD-10-CM | POA: Insufficient documentation

## 2017-05-25 DIAGNOSIS — S0083XA Contusion of other part of head, initial encounter: Secondary | ICD-10-CM | POA: Insufficient documentation

## 2017-05-25 DIAGNOSIS — Y999 Unspecified external cause status: Secondary | ICD-10-CM | POA: Insufficient documentation

## 2017-05-25 DIAGNOSIS — S0990XA Unspecified injury of head, initial encounter: Secondary | ICD-10-CM

## 2017-05-25 DIAGNOSIS — Y9389 Activity, other specified: Secondary | ICD-10-CM | POA: Diagnosis not present

## 2017-05-25 DIAGNOSIS — S0121XA Laceration without foreign body of nose, initial encounter: Secondary | ICD-10-CM | POA: Insufficient documentation

## 2017-05-25 MED ORDER — MORPHINE SULFATE (PF) 4 MG/ML IV SOLN
4.0000 mg | Freq: Once | INTRAVENOUS | Status: AC
  Administered 2017-05-25: 4 mg via INTRAVENOUS
  Filled 2017-05-25: qty 1

## 2017-05-25 MED ORDER — ONDANSETRON HCL 4 MG/2ML IJ SOLN
4.0000 mg | Freq: Once | INTRAMUSCULAR | Status: AC
  Administered 2017-05-25: 4 mg via INTRAVENOUS
  Filled 2017-05-25: qty 2
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
## 2017-05-25 NOTE — ED Notes (Signed)
Patient transported to X-ray 
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
## 2017-05-26 DIAGNOSIS — S0083XA Contusion of other part of head, initial encounter: Secondary | ICD-10-CM | POA: Diagnosis not present

## 2017-05-26 MED ORDER — OXYCODONE-ACETAMINOPHEN 5-325 MG PO TABS: 1.0000 | ORAL_TABLET | ORAL | 0 refills | Status: DC | PRN

## 2017-06-10 DIAGNOSIS — S022XXA Fracture of nasal bones, initial encounter for closed fracture: Secondary | ICD-10-CM | POA: Insufficient documentation

## 2017-06-30 ENCOUNTER — Encounter: Payer: Self-pay | Admitting: Physician Assistant

## 2017-07-13 DIAGNOSIS — E559 Vitamin D deficiency, unspecified: Secondary | ICD-10-CM | POA: Insufficient documentation

## 2018-10-22 ENCOUNTER — Emergency Department (HOSPITAL_COMMUNITY): Payer: Medicaid Other

## 2018-10-22 ENCOUNTER — Other Ambulatory Visit: Payer: Self-pay

## 2018-10-22 ENCOUNTER — Emergency Department (HOSPITAL_COMMUNITY)
Admission: EM | Admit: 2018-10-22 | Discharge: 2018-10-22 | Disposition: A | Discharge status: Home / Self Care | Payer: Medicaid Other | Attending: Emergency Medicine | Admitting: Emergency Medicine

## 2018-10-22 DIAGNOSIS — Z79899 Other long term (current) drug therapy: Secondary | ICD-10-CM | POA: Insufficient documentation

## 2018-10-22 DIAGNOSIS — M25562 Pain in left knee: Secondary | ICD-10-CM | POA: Diagnosis present

## 2018-10-22 DIAGNOSIS — W06XXXA Fall from bed, initial encounter: Secondary | ICD-10-CM | POA: Diagnosis not present

## 2018-10-22 DIAGNOSIS — W19XXXA Unspecified fall, initial encounter: Secondary | ICD-10-CM

## 2018-10-22 MED ORDER — HYDROCODONE-ACETAMINOPHEN 5-325 MG PO TABS: 1.0000 | ORAL_TABLET | Freq: Four times a day (QID) | ORAL | 0 refills | Status: DC | PRN

## 2018-10-22 MED ORDER — KETOROLAC TROMETHAMINE 15 MG/ML IJ SOLN
15.0000 mg | Freq: Once | INTRAMUSCULAR | Status: AC
  Administered 2018-10-22: 16:00:00 15 mg via INTRAMUSCULAR
  Filled 2018-10-22: qty 1

## 2018-10-22 MED ORDER — HYDROCODONE-ACETAMINOPHEN 5-325 MG PO TABS
1.0000 | ORAL_TABLET | Freq: Once | ORAL | Status: AC
  Administered 2018-10-22: 19:00:00 1 via ORAL
  Filled 2018-10-22: qty 1

## 2018-10-22 NOTE — Discharge Instructions (Addendum)
Follow up with orthopedics for further evaluation. You may follow up with [facility] ([facility]viously [facility]) or the on call doctor, Dr. Griffin Basil.  Use the knee immobilizer and crutches until then for pain control.  Take ibuprofen 3 times a day with meals.  Do not take other anti-inflammatories at the same time (Advil, Motrin, naproxen, Aleve). You may supplement with Tylenol if you need further pain control. Use norco as needed for severe or breakthrough pain. Have caution, this may make you tired or groggy. Do not drive or operate heavy machinery while taking this medicine.  Keep your leg elevated when able.  Apply ice, 20 minutes at a time, 3 times a day.  Follow up with [facility] to establish primary care.  Return to the ER with any new, worsening, or concerning symptoms.

## 2018-10-22 NOTE — ED Notes (Signed)
Discharge instructions discussed with pt. Pt verbalized understanding. No questions at this time. Pt to go home with sister.

## 2018-10-22 NOTE — ED Notes (Signed)
Ice applied to lt knee.
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
## 2018-10-22 NOTE — ED Provider Notes (Signed)
[facility] Provider Note   CSN: [id number] Arrival date & time: 10/22/18  1535     History   Chief Complaint No chief complaint on file.   HPI [name] is a 53 y.o. female presenting for evaluation of left knee pain.  Patient states yesterday she was on the bed when she slipped and fell off, however her left foot got caught between the mattress and the frame.  As such, her lower leg twisted.  She fell onto her left hip.  She denies hitting her head or loss of consciousness.  Immediately after the injury she was unable to stand up due to pain.  Symptoms improved mildly with ibuprofen last night.  Today, pain persisted and she has continued swelling of her left knee.  She has not taken anything for this.  She states she is unable to bear weight due to pain.  [facility] years [facility] after a fall, has not followed up orthopedics since.  She denies numbness or tingling.  She denies radiation of the pain.     HPI  Past Medical History:  Diagnosis Date  . Allergy   . Anemia   . Anxiety   . Chlamydia   . Depression   . Irritable bowel syndrome (IBS)     Patient Active Problem List   Diagnosis Date Noted  . Chest pain 11/23/2014    Past Surgical History:  Procedure Laterality Date  . CESAREAN SECTION    . TONSILLECTOMY    . TUBAL LIGATION       OB History    Gravida  2   Para  2   Term  2   Preterm      AB      Living  2     SAB      TAB      Ectopic      Multiple      Live Births               Home Medications    Prior to Admission medications   Medication Sig Start Date End Date Taking? Authorizing Provider  HYDROcodone-acetaminophen (NORCO/VICODIN) 5-325 MG tablet Take 1 tablet by mouth every 6 (six) hours as needed. 10/22/18   Livian Vanderbeck, PA-C  ibuprofen (ADVIL,MOTRIN) 800 MG tablet Take 1 tablet (800 mg total) by mouth every 8 (eight) hours as needed. Patient not  taking: Reported on 05/26/2017 01/18/16   Charlestine NightLawyer, Christopher, PA-C  methocarbamol (ROBAXIN) 500 MG tablet Take 1 tablet (500 mg total) by mouth 2 (two) times daily. Patient not taking: Reported on 05/26/2017 06/15/15   Zadie RhineWickline, Donald, MD  nitroGLYCERIN (NITROSTAT) 0.4 MG SL tablet Place 1 tablet (0.4 mg total) under the tongue every 5 (five) minutes as needed for chest pain. 11/23/14   Chilton Siandolph, Tiffany, MD  oxyCODONE-acetaminophen (PERCOCET) 5-325 MG tablet Take 1 tablet by mouth every 4 (four) hours as needed. 05/26/17   Gilda CreasePollina, Christopher J, MD  traMADol (ULTRAM) 50 MG tablet Take 1 tablet (50 mg total) by mouth every 6 (six) hours as needed for severe pain. Patient not taking: Reported on 05/26/2017 01/18/16   Charlestine NightLawyer, Christopher, PA-C    Family History Family History  Problem Relation Age of Onset  . Heart disease Mother   . Hyperlipidemia Mother   . Hypertension Mother   . Mental illness Mother   . Diabetes Father   . Hyperlipidemia Father   . Hypertension Father   .  Diabetes Maternal Grandmother   . Heart disease Maternal Grandmother   . Hyperlipidemia Maternal Grandmother   . Hypertension Maternal Grandmother   . Stroke Maternal Grandmother   . Heart disease Maternal Grandfather   . Stroke Maternal Grandfather     Social History Social History   Tobacco Use  . Smoking status: Never Smoker  . Smokeless tobacco: Never Used  Substance Use Topics  . Alcohol use: Yes    Alcohol/week: 0.0 standard drinks    Comment: social alcohol  . Drug use: No     Allergies   Patient has no known allergies.   Review of Systems Review of Systems  Musculoskeletal: Positive for arthralgias.  Neurological: Negative for numbness.  Hematological: Does not bruise/bleed easily.     Physical Exam Updated Vital Signs BP 115/74   Pulse (!) 101   Temp 98.9 F (37.2 C) (Oral)   Resp 18   LMP 10/22/2018 Comment: Waiver signed, states neg preg test at urgent care on 10/21/18  SpO2  99%   Physical Exam Vitals signs and nursing note reviewed.  Constitutional:      General: She is not in acute distress.    Appearance: She is well-developed.     Comments: Appears nontoxic  HENT:     Head: Normocephalic and atraumatic.  Eyes:     Conjunctiva/sclera: Conjunctivae normal.     Pupils: Pupils are equal, round, and reactive to light.  Neck:     Musculoskeletal: Normal range of motion and neck supple.  Cardiovascular:     Rate and Rhythm: Normal rate and regular rhythm.  Pulmonary:     Effort: Pulmonary effort is normal. No respiratory distress.     Breath sounds: Normal breath sounds. No wheezing.  Abdominal:     General: There is no distension.     Palpations: Abdomen is soft.     Tenderness: There is no abdominal tenderness.  Musculoskeletal:        General: Swelling and tenderness present.     Comments: Tenderness palpation of left low back musculature and over spine.  No tenderness palpation of right low back musculature.  No step-offs or deformities. Tenderness palpation of left hip over the greater trochanter.  No obvious deformity. Swelling of the anterior left knee.  Tenderness palpation along the medial joint line, posterior knee, and anterior knee.  Minimal to no pain over the lateral knee.  No obvious deformity. No tenderness palpation of the calf or lower leg.  No tenderness palpation of foot.  Pedal pulses intact.  Sensation intact bilaterally.  Skin:    General: Skin is warm and dry.     Capillary Refill: Capillary refill takes less than 2 seconds.  Neurological:     Mental Status: She is alert and oriented to person, place, and time.      ED Treatments / Results  Labs (all labs ordered are listed, but only abnormal results are displayed) Labs Reviewed - No data to display  EKG None  Radiology Dg Lumbar Spine 2-3 Views  Result Date: 10/22/2018 CLINICAL DATA:  Pt states she was standing on her bed and slipped and fell landing on left hip on  10/21/18. States left knee twisted and bent. Pain with ambulation in left knee. Previous left leg injury in 2018. Best obtainable images due to pt condition. EXAM: LUMBAR SPINE - 2-3 VIEW COMPARISON:  None. FINDINGS: There is no evidence of lumbar spine fracture. Alignment is normal. Intervertebral disc spaces are maintained. IMPRESSION: Negative. Electronically  Signed   By: Nolon Nations M.D.   On: 10/22/2018 18:29   Dg Knee Complete 4 Views Left  Result Date: 10/22/2018 CLINICAL DATA:  Pt states she was standing on her bed and slipped and fell landing on left hip on 10/21/18. States left knee twisted and bent. Pain with ambulation in left knee. Previous left leg injury in 2018. Best obtainable images due to pt condition. EXAM: LEFT KNEE - COMPLETE 4+ VIEW COMPARISON:  None FINDINGS: Mild degenerative changes are present at the patellofemoral compartment. No acute fracture or subluxation. No joint effusion. IMPRESSION: No evidence for acute abnormality. Electronically Signed   By: Nolon Nations M.D.   On: 10/22/2018 18:27   Dg Hips Bilat W Or Wo Pelvis 3-4 Views  Result Date: 10/22/2018 CLINICAL DATA:  Pt states she was standing on her bed and slipped and fell landing on left hip on 10/21/18. States left knee twisted and bent. Pain with ambulation in left knee. Previous left leg injury in 2018. Best obtainable images due to pt condition. EXAM: DG HIP (WITH OR WITHOUT PELVIS) 3-4V BILAT COMPARISON:  None. FINDINGS: There is no evidence of hip fracture or dislocation. There is no evidence of arthropathy or other focal bone abnormality. IMPRESSION: Negative. Electronically Signed   By: Nolon Nations M.D.   On: 10/22/2018 18:28    Procedures Procedures (including critical care time)  Medications Ordered in ED Medications  HYDROcodone-acetaminophen (NORCO/VICODIN) 5-325 MG per tablet 1 tablet (has no administration in time range)  ketorolac (TORADOL) 15 MG/ML injection 15 mg (15 mg Intramuscular  Given 10/22/18 1613)     Initial Impression / Assessment and Plan / ED Course  I have reviewed the triage vital signs and the nursing notes.  Pertinent labs & imaging results that were available during my care of the patient were reviewed by me and considered in my medical decision making (see chart for details).        Patient presenting for evaluation of knee pain after fall yesterday.  Physical examination, she appears nontoxic.  She is neurovascularly intact.  Patient mostly complaining of knee pain, however on exam she also has some mild hip and low back pain.  As such, will obtain x-rays of all 3 sites.  Toradol for pain.  X-rays viewed interpreted by me, no fracture dislocation.  Discussed findings with patient.  Discussed treatment with knee immobilizer, crutches, NSAIDs, Tylenol, Norco.  PMP checked, patient without concerning narcotic use.  Will have patient follow-up with orthopedics.  Patient also given resources for primary care.  At this time, patient appears safe for discharge.  Return precautions given.  Patient states she understands and agrees to plan.   Final Clinical Impressions(s) / ED Diagnoses   Final diagnoses:  Acute pain of left knee  Fall, initial encounter    ED Discharge Orders         Ordered    HYDROcodone-acetaminophen (NORCO/VICODIN) 5-325 MG tablet  Every 6 hours PRN,   Status:  Discontinued     10/22/18 1843    HYDROcodone-acetaminophen (NORCO/VICODIN) 5-325 MG tablet  Every 6 hours PRN     10/22/18 1843           Franchot Heidelberg, PA-C 10/22/18 1903    Sherwood Gambler, MD 10/23/18 1600

## 2018-10-27 ENCOUNTER — Encounter: Payer: Self-pay | Admitting: Family Medicine

## 2018-10-31 ENCOUNTER — Telehealth: Payer: Medicaid Other | Admitting: Family Medicine

## 2018-11-07 ENCOUNTER — Telehealth (INDEPENDENT_AMBULATORY_CARE_PROVIDER_SITE_OTHER): Payer: Medicaid Other | Admitting: Family Medicine

## 2018-11-07 ENCOUNTER — Encounter: Payer: Self-pay | Admitting: Family Medicine

## 2018-11-07 DIAGNOSIS — Z1211 Encounter for screening for malignant neoplasm of colon: Secondary | ICD-10-CM | POA: Diagnosis not present

## 2018-11-07 DIAGNOSIS — I10 Essential (primary) hypertension: Secondary | ICD-10-CM

## 2018-11-07 DIAGNOSIS — M25562 Pain in left knee: Secondary | ICD-10-CM | POA: Diagnosis not present

## 2018-11-07 DIAGNOSIS — Z09 Encounter for follow-up examination after completed treatment for conditions other than malignant neoplasm: Secondary | ICD-10-CM

## 2018-11-07 DIAGNOSIS — W01198A Fall on same level from slipping, tripping and stumbling with subsequent striking against other object, initial encounter: Secondary | ICD-10-CM | POA: Diagnosis not present

## 2018-11-07 MED ORDER — BLOOD PRESSURE MONITOR DEVI: 0 refills | Status: DC

## 2018-11-07 MED ORDER — HYDROCHLOROTHIAZIDE 25 MG PO TABS: 25.0000 mg | ORAL_TABLET | Freq: Every day | ORAL | 0 refills | Status: AC

## 2018-11-07 NOTE — Progress Notes (Signed)
Virtual Visit via Video Note  I connected with [name] on 11/07/18 at 11:10 AM EDT by a video enabled telemedicine application and verified that I am speaking with the correct person using two identifiers.  Location: Patient: Home Provider: Office at [facility]   I discussed the limitations of evaluation and management by telemedicine and the availability of in person appointments. The patient expressed understanding and agreed to proceed.  History of Present Illness:      53 yo female new to the practice who is seen in follow-up of recent ED visit at [facility] secondary to left knee pain as a result of injury. Patient states that she was standing on her bed put her foot on her footboard for balance but the footboard was loose and her foot slipped between the mattress and footboard and she feel forward onto the floor but her left leg/knee was twisted as her foot was caught between the mattress and footboard but slipped out with the fall.  Patient fell onto her left side and her left hip.  She did have immediate onset of pain and felt as if she had some swelling in her left knee after the fall.  She had difficulty standing up and bearing weight on her left knee/left leg after the fall.  She took ibuprofen and also iced her knee. Patient went to the ED the next day as she still had significant pain and difficulty walking due to the pain. She also has had some lower back and left hip pain which have mostly gone away.       She reports that her pain is now between a 5-8 on a 0-to-10 scale with 10 being the worst imaginable pain.  Patient states icing her knee and not bearing weight help her knee feel better.  Knee is worse with weightbearing and she still feels as if she cannot bear weight properly on her left knee.  Patient was referred to orthopedics at her emergency department visit.  Patient needs to have a referral as well before she can go to the visit when she contacted the orthopedic office.    She also has a history of hypertension and reports that she needs a refill of her medicine, hydrochlorothiazide.  She feels her blood pressure has been well controlled and she has had no headaches or dizziness related to her blood pressure. She did to someone in this office who told her that she can obtain a BP monitor by prescription and she would like this as well for home monitoring.   On review of systems she also denies any chest pain or palpitations, no shortness of breath or cough, no abdominal pain-no nausea/vomiting/diarrhea or constipation.  No peripheral edema other than the swelling in her left knee which does improve after icing.  She has had no blood in the stool and no dark or black stools.  She has not had a baseline colonoscopy.   Observations/Objective: Well-nourished well-developed female in no acute distress on video visit.  Normal speech and tone.  Normal mood and behavior.  No increased work of breathing.  Patient is seated during phone encounter.   Assessment and Plan: 1. Acute pain of left knee; 4.  Examination for follow-up after treatment at a hospital Notes reviewed from patient's recent emergency department visit on 10/22/2018 due to left knee pain and swelling status post fall.  X-rays did not show any acute abnormality.  Referral placed to orthopedics as per ED discharge summary.  Continue  use of knee immobilizer, crutches, NSAIDs as per hospital discharge.  Continue icing the knee as needed for swelling. - AMB referral to orthopedics  2. Essential hypertension Patient reports that her blood pressure is well controlled on her current hydrochlorothiazide.  Blood pressure at her 10/22/2018 ED visit was normal at 115/74.  Medication refill provided and discussed low-salt/low-sodium DASH diet.  Prescription printed and will be sent to home health company for patient to obtain home blood pressure monitor. - hydrochlorothiazide (HYDRODIURIL) 25 MG tablet; Take 1 tablet (25 mg  total) by mouth daily.  Dispense: 90 tablet; Refill: 0 - Blood Pressure Monitor DEVI; Use as directed to check blood pressure once or twice daily  Dispense: 1 kit; Refill: 0  3. Screening for colon cancer Patient has had no baseline screening for colon cancer and referral will be placed to gastroenterology. - Ambulatory referral to Gastroenterology   Follow Up Instructions:Return for HTN f/u in 3-4 months; needs annual well exam.    I discussed the assessment and treatment plan with the patient. The patient was provided an opportunity to ask questions and all were answered. The patient agreed with the plan and demonstrated an understanding of the instructions.   The patient was advised to call back or seek an in-person evaluation if the symptoms worsen or if the condition fails to improve as anticipated.  I provided 17 minutes of non-face-to-face time during this encounter.   Antony Blackbird, MD

## 2018-11-07 NOTE — Progress Notes (Signed)
Patient here for ED follow up. Still having some L knee pain & swelling. Is still wearing a stabilizing brace & using braces.  Would like refill on BP medication. Doesn't check BP at home

## 2018-11-08 ENCOUNTER — Telehealth: Payer: Self-pay | Admitting: *Deleted

## 2018-11-08 NOTE — Telephone Encounter (Signed)
Patient Blood pressure Monitor Device script was faxed to [facility] [phone] and patient is aware.

## 2018-11-09 ENCOUNTER — Other Ambulatory Visit: Payer: Self-pay

## 2018-11-09 DIAGNOSIS — Z20822 Contact with and (suspected) exposure to covid-19: Secondary | ICD-10-CM

## 2018-11-10 LAB — NOVEL CORONAVIRUS, NAA: SARS-CoV-2, NAA: NOT DETECTED

## 2018-11-11 ENCOUNTER — Encounter: Payer: Self-pay | Admitting: Gastroenterology

## 2018-12-12 ENCOUNTER — Encounter: Payer: Self-pay | Admitting: Physical Therapy

## 2018-12-12 ENCOUNTER — Other Ambulatory Visit: Payer: Self-pay | Admitting: Gastroenterology

## 2018-12-12 ENCOUNTER — Other Ambulatory Visit: Payer: Self-pay

## 2018-12-12 ENCOUNTER — Ambulatory Visit (AMBULATORY_SURGERY_CENTER): Payer: Self-pay | Admitting: *Deleted

## 2018-12-12 ENCOUNTER — Ambulatory Visit: Payer: Medicaid Other | Attending: Orthopaedic Surgery | Admitting: Physical Therapy

## 2018-12-12 VITALS — Temp 96.9°F | Ht 67.0 in | Wt 250.0 lb

## 2018-12-12 DIAGNOSIS — R600 Localized edema: Secondary | ICD-10-CM | POA: Diagnosis present

## 2018-12-12 DIAGNOSIS — M25562 Pain in left knee: Secondary | ICD-10-CM | POA: Insufficient documentation

## 2018-12-12 DIAGNOSIS — M6281 Muscle weakness (generalized): Secondary | ICD-10-CM | POA: Diagnosis present

## 2018-12-12 DIAGNOSIS — Z1211 Encounter for screening for malignant neoplasm of colon: Secondary | ICD-10-CM

## 2018-12-12 MED ORDER — NA SULFATE-K SULFATE-MG SULF 17.5-3.13-1.6 GM/177ML PO SOLN: ORAL | 0 refills | Status: DC

## 2018-12-12 NOTE — Progress Notes (Signed)
Patient is here in-person for PV. Patient denies any allergies to eggs or soy. Patient denies any problems with anesthesia/sedation. Patient denies any oxygen use at home. Patient denies taking any diet/weight loss medications or blood thinners. Patient is not being treated for MRSA or C-diff. EMMI education assisgned to patient on colonoscopy, this was explained and instructions given to patient. Pt is aware that care partner will wait in the car during procedure; if they feel like they will be too hot to wait in the car; they may wait in the lobby.  We want them to wear a mask (we do not have any that we can provide them), practice social distancing, and we will check their temperatures when they get here.  I did remind patient that their care partner needs to stay in the parking lot the entire time. Pt will wear mask into building. 

## 2018-12-12 NOTE — Therapy (Signed)
[facility] [address] [place], [place], [address] Phone: [phone]   Fax:  [phone]  Physical Therapy Evaluation  Patient Details  Name: [name] MRN: [id number] Date of Birth: 1965-05-20 Referring Provider (PT): Ramond Marrow, MD   Encounter Date: 12/12/2018  PT End of Session - 12/12/18 1155    Visit Number  1    Authorization Type  MCD- submitted 9/14    PT Start Time  1148    PT Stop Time  1230    PT Time Calculation (min)  42 min    Activity Tolerance  Patient tolerated treatment well    Behavior During Therapy  [facility] for tasks assessed/performed       Past Medical History:  Diagnosis Date  . Allergy   . Anemia   . Anxiety   . Chlamydia   . Depression   . Irritable bowel syndrome (IBS)     Past Surgical History:  Procedure Laterality Date  . CESAREAN SECTION    . TONSILLECTOMY    . TUBAL LIGATION      There were no vitals filed for this visit.   Subjective Assessment - 12/12/18 1155    Subjective  Dr Everardo Pacific told me I have a torn MCL. It is still really stiff, stopped wearing the knee brace and he told me to walk on it an move it.    How long can you stand comfortably?  30 min    Patient Stated Goals  decrease pain, climb stairs step over step, be on knees, exercise- home workouts (jumping jax, run, basketball)    Currently in Pain?  Yes    Pain Score  2     Pain Location  Knee    Pain Orientation  Left    Pain Descriptors / Indicators  Aching   unstable, clicking   Aggravating Factors   weight bearing    Pain Relieving Factors  ice         [facility] PT Assessment - 12/12/18 0001      Assessment   Medical Diagnosis  Lt knee pain    Referring Provider (PT)  Ramond Marrow, MD    Onset Date/Surgical Date  10/21/18    Hand Dominance  Right    Next MD Visit  --   Later this month   Prior Therapy  no      Precautions   Precautions  None      Restrictions   Weight Bearing Restrictions  No       Balance Screen   Has the patient fallen in the past 6 months  Yes    How many times?  1    Has the patient had a decrease in activity level because of a fear of falling?   Yes    Is the patient reluctant to leave their home because of a fear of falling?   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, on Physicist, medical   Overall Cognitive Status  Within Functional Limits for tasks assessed      Observation/Other Assessments-Edema    Edema  --   edema noted in [place]  knee vs Rt     Sensation   Additional Comments  WFL      ROM / Strength   AROM / PROM / Strength  Strength      Strength   Strength Assessment Site  Hip;Knee    Right/Left Hip  Right;Left    Right Hip Flexion  5/5    Right Hip ABduction  5/5    Left Hip Flexion  3+/5   pain   Left Hip ABduction  3/5    Right/Left Knee  Left    Left Knee Flexion  4/5    Left Knee Extension  3+/5      Palpation   Palpation comment  TTP distal MCL attachment, distal patellar tendon attachment      Ambulation/Gait   Gait Comments  antalgic with stiff knee and flat foot                Objective measurements completed on examination: See above findings.      [facility]/Exercise - 12/12/18 0001      Exercises   Exercises  Other Exercises    Other Exercises   see scanned pt instructions             PT Education - 12/12/18 1333    Education Details  anatomy of condition, POC, HEP, exercise form/rationale    Person(s) Educated  Patient    Methods  Explanation;Demonstration;Tactile cues;Verbal cues;Handout    Comprehension  Verbalized understanding;Returned demonstration;Verbal cues required;Tactile cues required;Need further instruction       PT  Short Term Goals - 12/12/18 1327      PT SHORT TERM GOAL #1   Title  Pt will be independent in short term HEP    Baseline  began establishing at eval    Time  4    Period  Weeks    Status  New    Target Date  01/06/19      PT SHORT TERM GOAL #2   Title  Pt will demo proper heel/toe gait pattern for short distances without limping    Baseline  began practicing at eval    Time  4    Period  Weeks    Status  New    Target Date  01/06/19      PT SHORT TERM GOAL #3   Title  pt will demo proper step-over-step pattern on stairs    Baseline  not doing at eval due to apin    Time  4    Period  Weeks    Status  New    Target Date  01/06/19        PT Long Term Goals - 12/12/18 1331      PT LONG TERM GOAL #1   Title  to be set at re-eval PRN             Plan - 12/12/18 1322    Clinical Impression Statement  Pt presents to PT about 7 weeks after a fall that resulted in significant Lt knee pain. Reports proximal and distal structures are fine now. [facility] most tenderness noted at distal patellar tendon insertion. We discussed progression moving forward and plan for treatment. advised that without any obvious anatomical damage, surgery would not do anything to correct pain. Pt did very well with exercises and cues for heel-toe pattern and reported feeling much better following evaluation today. will continue to benefit from skilled PT in order to decrease pain  and meet functional goals.    Personal Factors and Comorbidities  Comorbidity 1    Comorbidities  anxiety/depression, recent weight gain    Examination-Activity Limitations  Bathing;Locomotion Level;Bed Mobility;Caring for Others;Sit;Sleep;Squat;Dressing;Stairs;Stand;Hygiene/Grooming    Examination-Participation Restrictions  Meal Prep;Cleaning;Community Activity;Laundry    Stability/Clinical Decision Making  Stable/Uncomplicated    Clinical Decision Making  Low    Rehab Potential  Good    PT Frequency  --   3 visits  in first auth   PT Treatment/Interventions  ADLs/Self Care Home Management;Cryotherapy;Electrical Stimulation;Ultrasound;Moist Heat;Iontophoresis 4mg /ml Dexamethasone;Gait training;Stair training;Functional mobility training;Therapeutic activities;Therapeutic exercise;Balance training;Patient/family education;Neuromuscular re-education;Manual techniques;Passive range of motion;Dry needling;Vasopneumatic Device;Taping    PT Next Visit Plan  consider ionto or [place] to knee. CKC stability challenges    PT Home Exercise Plan  HSS, gastroc stretch, standing hip ext, standing hip abd, seated SLR, heel-toe gait    Consulted and Agree with Plan of Care  Patient       Patient will benefit from skilled therapeutic intervention in order to improve the following deficits and impairments:  Abnormal gait, Difficulty walking, Increased muscle spasms, Decreased activity tolerance, Pain, Improper body mechanics, Impaired flexibility, Decreased strength, Increased edema, Decreased balance  Visit Diagnosis: Acute pain of left knee - Plan: PT plan of care cert/re-cert  Muscle weakness (generalized) - Plan: PT plan of care cert/re-cert  Localized edema - Plan: PT plan of care cert/re-cert     Problem List There are no active problems to display for this patient.   Pacey Altizer C. Parminder Cupples PT, DPT 12/12/18 1:36 PM   [facility] [address] [place], [place], [address] Phone: [phone]   Fax:  [phone]  Name: [name] MRN: [id number] Date of Birth: 04/27/1965

## 2018-12-23 ENCOUNTER — Telehealth: Payer: Self-pay | Admitting: Gastroenterology

## 2018-12-23 NOTE — Telephone Encounter (Signed)
Pt responded "no" to all screening questions °

## 2018-12-23 NOTE — Telephone Encounter (Signed)
LM to call back regarding the Covid-19 screening questions  Do you now or have you had a fever in the last 14 days?  Do you have any respiratory symptoms of shortness of breath or cough now or in the last 14 days?  Do you have any family members or close contacts with diagnosed or suspected Covid-19 in the past 14 days?  Have you been tested for Covid-19 and found to be positive?

## 2018-12-26 ENCOUNTER — Encounter: Payer: Self-pay | Admitting: Gastroenterology

## 2018-12-26 ENCOUNTER — Ambulatory Visit (AMBULATORY_SURGERY_CENTER): Payer: Medicaid Other | Admitting: Gastroenterology

## 2018-12-26 ENCOUNTER — Other Ambulatory Visit: Payer: Self-pay

## 2018-12-26 VITALS — BP 169/98 | HR 83 | Temp 98.7°F | Resp 14 | Ht 67.0 in | Wt 250.0 lb

## 2018-12-26 DIAGNOSIS — K5730 Diverticulosis of large intestine without perforation or abscess without bleeding: Secondary | ICD-10-CM

## 2018-12-26 DIAGNOSIS — Z1211 Encounter for screening for malignant neoplasm of colon: Secondary | ICD-10-CM | POA: Diagnosis not present

## 2018-12-26 MED ORDER — SODIUM CHLORIDE 0.9 % IV SOLN: 500.0000 mL | Freq: Once | INTRAVENOUS | Status: DC

## 2018-12-26 NOTE — Progress Notes (Signed)
Report given to PACU, vss 

## 2018-12-26 NOTE — Patient Instructions (Signed)
Impression/Recommendations:  Diverticulosis handout given to patient. High fiber diet handout given to patient.  Resume regular diet.   Continue present medications.  Repeat colonoscopy in 10 years for screening.  YOU HAD AN ENDOSCOPIC PROCEDURE TODAY AT [facility]:   Refer to the procedure report that was given to you for any specific questions about what was found during the examination.  If the procedure report does not answer your questions, please call your gastroenterologist to clarify.  If you requested that your care partner not be given the details of your procedure findings, then the procedure report has been included in a sealed envelope for you to review at your convenience later.  YOU SHOULD EXPECT: Some feelings of bloating in the abdomen. Passage of more gas than usual.  Walking can help get rid of the air that was put into your GI tract during the procedure and reduce the bloating. If you had a lower endoscopy (such as a colonoscopy or flexible sigmoidoscopy) you may notice spotting of blood in your stool or on the toilet paper. If you underwent a bowel prep for your procedure, you may not have a normal bowel movement for a few days.  Please Note:  You might notice some irritation and congestion in your nose or some drainage.  This is from the oxygen used during your procedure.  There is no need for concern and it should clear up in a day or so.  SYMPTOMS TO REPORT IMMEDIATELY:   Following lower endoscopy (colonoscopy or flexible sigmoidoscopy):  Excessive amounts of blood in the stool  Significant tenderness or worsening of abdominal pains  Swelling of the abdomen that is new, acute  Fever of 100F or higher For urgent or emergent issues, a gastroenterologist can be reached at any hour by calling [phone].   DIET:  We do recommend a small meal at first, but then you may proceed to your regular diet.  Drink plenty of fluids but you should avoid  alcoholic beverages for 24 hours.  ACTIVITY:  You should plan to take it easy for the rest of today and you should NOT DRIVE or use heavy machinery until tomorrow (because of the sedation medicines used during the test).    FOLLOW UP: Our staff will call the number listed on your records 48-72 hours following your procedure to check on you and address any questions or concerns that you may have regarding the information given to you following your procedure. If we do not reach you, we will leave a message.  We will attempt to reach you two times.  During this call, we will ask if you have developed any symptoms of COVID 19. If you develop any symptoms (ie: fever, flu-like symptoms, shortness of breath, cough etc.) before then, please call [phone].  If you test positive for Covid 19 in the 2 weeks post procedure, please call and report this information to [place].    If any biopsies were taken you will be contacted by phone or by letter within the next 1-3 weeks.  Please call us at [phone] if you have not heard about the biopsies in 3 weeks.    SIGNATURES/CONFIDENTIALITY: You and/or your care partner have signed paperwork which will be entered into your electronic medical record.  These signatures attest to the fact that that the information above on your After Visit Summary has been reviewed and is understood.  Full responsibility of the confidentiality of this discharge information lies with you  and/or your care-partner. 

## 2018-12-26 NOTE — Progress Notes (Signed)
Temp check by KA/vital check by CW.  Patient states no changes in medical or surgical history since pre-visit screening on 12/12/18.

## 2018-12-26 NOTE — Progress Notes (Signed)
Pt.'s abdomen soft.  Pt. Has been passing air.  Pt. C/o abdominal discomfort.  Given instructions to facilitate passing air at home.  Pt. Taken downstairs for discharge, then returned to the floor to use the restroom.  Able to void and pass more air.

## 2018-12-26 NOTE — Op Note (Signed)
[facility] Patient Name: [name] Procedure Date: 12/26/2018 2:01 PM MRN: [id number] Endoscopist: Tressia Danas MD, MD Age: 53 Referring MD:  Date of Birth: 17-Mar-1966 Gender: Female Account #: [id number] Procedure:                Colonoscopy Indications:              Screening for colorectal malignant neoplasm, This                            is the patient's first colonoscopy                           No known family history of colon cancer or polyps Medicines:                See the Anesthesia note for documentation of the                            administered medications Procedure:                Pre-Anesthesia Assessment:                           - Prior to the procedure, a History and Physical                            was performed, and patient medications and                            allergies were reviewed. The patient's tolerance of                            previous anesthesia was also reviewed. The risks                            and benefits of the procedure and the sedation                            options and risks were discussed with the patient.                            All questions were answered, and informed consent                            was obtained. Prior Anticoagulants: The patient has                            taken no previous anticoagulant or antiplatelet                            agents. ASA Grade Assessment: II - A patient with                            mild systemic disease. After reviewing the risks  and benefits, the patient was deemed in                            satisfactory condition to undergo the procedure.                           After obtaining informed consent, the colonoscope                            was passed under direct vision. Throughout the                            procedure, the patient's blood pressure, pulse, and                            oxygen saturations were  monitored continuously. The                            Colonoscope was introduced through the anus and                            advanced to the the terminal ileum, with                            identification of the appendiceal orifice and IC                            valve. No baseline GI symptoms. The colonoscopy was                            performed without difficulty. However, I could not                            perform a retroflex in the returm as I was unable                            to keep enough air in the rectum to allow for                            visualization. The patient tolerated the procedure                            well. The quality of the bowel preparation was                            good. The terminal ileum, ileocecal valve,                            appendiceal orifice, and rectum were photographed. Scope In: 2:12:07 PM Scope Out: 2:27:07 PM Scope Withdrawal Time: 0 hours 9 minutes 51 seconds  Total Procedure Duration: 0 hours 15 minutes 0 seconds  Findings:                 The perianal  and digital rectal examinations were                            normal.                           A few small-mouthed diverticula were found in the                            sigmoid colon and ascending colon.                           The exam was otherwise without abnormality on                            direct and retroflexion views. Complications:            No immediate complications. Estimated Blood Loss:     Estimated blood loss: none. Impression:               - Diverticulosis in the sigmoid colon and ascending                            colon.                           - The examination was otherwise normal on direct                            and retroflexion views.                           - No specimens collected. Recommendation:           - Patient has a contact number available for                            emergencies. The signs and symptoms of  potential                            delayed complications were discussed with the                            patient. Return to normal activities tomorrow.                            Written discharge instructions were provided to the                            patient.                           - Resume regular diet. High fiber diet encouraged.                           - Continue present medications.                           -  Repeat colonoscopy in 10 years for screening                            purposes, earlier with any new symptoms. Thornton Park MD, MD 12/26/2018 2:35:45 PM This report has been signed electronically.

## 2018-12-27 ENCOUNTER — Ambulatory Visit: Payer: Medicaid Other | Admitting: Physical Therapy

## 2018-12-28 ENCOUNTER — Telehealth: Payer: Self-pay

## 2018-12-28 NOTE — Telephone Encounter (Signed)
  Follow up Call-  Call back number 12/26/2018  Post procedure Call Back phone  # [phone]  Permission to leave phone message Yes  Some recent data might be hidden     Patient questions:  Do you have a fever, pain , or abdominal swelling? No. Pain Score  0 *  Have you tolerated food without any problems? Yes.  Have you been able to return to your normal activities? Yes.    Do you have any questions about your discharge instructions: Diet   No. Medications  No. Follow up visit  No.  Do you have questions or concerns about your Care? No.  Actions: * If pain score is 4 or above: No action needed, pain <4.  1. Have you developed a fever since your procedure? No  2.   Have you had an respiratory symptoms (SOB or cough) since your procedure? No  3.   Have you tested positive for COVID 19 since your procedure No  4.   Have you had any family members/close contacts diagnosed with the COVID 19 since your procedure? No   If yes to any of these questions please route to Joylene John, RN and Alphonsa Gin, RN.

## 2018-12-29 ENCOUNTER — Other Ambulatory Visit: Payer: Self-pay

## 2018-12-29 ENCOUNTER — Telehealth: Payer: Self-pay | Admitting: Physical Therapy

## 2018-12-29 ENCOUNTER — Ambulatory Visit: Payer: Medicaid Other | Attending: Orthopaedic Surgery | Admitting: Physical Therapy

## 2018-12-29 ENCOUNTER — Encounter: Payer: Self-pay | Admitting: Physical Therapy

## 2018-12-29 DIAGNOSIS — M25562 Pain in left knee: Secondary | ICD-10-CM | POA: Diagnosis present

## 2018-12-29 DIAGNOSIS — M6281 Muscle weakness (generalized): Secondary | ICD-10-CM

## 2018-12-29 DIAGNOSIS — R600 Localized edema: Secondary | ICD-10-CM

## 2018-12-29 NOTE — Therapy (Signed)
[facility] [address] [place], [place], [address] Phone: [phone]   Fax:  [phone]  Physical Therapy Treatment  Patient Details  Name: [name] MRN: [id number] Date of Birth: [birth year] Referring Provider (PT): Ramond Marrow, MD   Encounter Date: 12/29/2018  PT End of Session - 12/29/18 1739    Visit Number  2    Authorization Type  MCD    PT Start Time  1736    PT Stop Time  1800    PT Time Calculation (min)  24 min    Activity Tolerance  Patient tolerated treatment well    Behavior During Therapy  [name] for tasks assessed/performed       Past Medical History:  Diagnosis Date  . Allergy   . Anemia   . Anxiety   . Chlamydia   . Depression   . Hypertension   . Irritable bowel syndrome (IBS)     Past Surgical History:  Procedure Laterality Date  . CESAREAN SECTION     x2  . TONSILLECTOMY    . TUBAL LIGATION      There were no vitals filed for this visit.  Subjective Assessment - 12/29/18 1738    Subjective  I hurt my knee today playing around with my son.    Patient Stated Goals  decrease pain, climb stairs step over step, be on knees, exercise- home workouts (jumping jax, run, basketball)    Currently in Pain?  Yes    Pain Score  2     Pain Location  Knee                       [facility] Adult PT Treatment/Exercise - 12/29/18 0001      Exercises   Exercises  Knee/Hip      Knee/Hip Exercises: Stretches   Gastroc Stretch Limitations  long sitting gastroc & hamstring stretch      Knee/Hip Exercises: Standing   SLS  with level pelvis    Gait Training  heel-toe pattern      Modalities   Modalities  Ultrasound      Ultrasound   Ultrasound Location  Lt MCL    Ultrasound Parameters  .8 w/cm2 cont 8 min    Ultrasound Goals  Pain               PT Short Term Goals - 12/12/18 1327      PT SHORT TERM GOAL #1   Title  Pt will be independent in short term HEP    Baseline   began establishing at eval    Time  4    Period  Weeks    Status  New    Target Date  01/06/19      PT SHORT TERM GOAL #2   Title  Pt will demo proper heel/toe gait pattern for short distances without limping    Baseline  began practicing at eval    Time  4    Period  Weeks    Status  New    Target Date  01/06/19      PT SHORT TERM GOAL #3   Title  pt will demo proper step-over-step pattern on stairs    Baseline  not doing at eval due to apin    Time  4    Period  Weeks    Status  New    Target Date  01/06/19        PT  Long Term Goals - 12/12/18 1331      PT LONG TERM GOAL #1   Title  to be set at re-eval PRN            Plan - 12/29/18 1801    Clinical Impression Statement  Pt was able to demo proper heel-toe pattern without pain following [place] and stretching. Cues required for level pelvis and engage gluts rather than popping her hip out to the side.    PT Treatment/Interventions  ADLs/Self Care Home Management;Cryotherapy;Electrical Stimulation;Ultrasound;Moist Heat;Iontophoresis 4mg /ml Dexamethasone;Gait training;Stair training;Functional mobility training;Therapeutic activities;Therapeutic exercise;Balance training;Patient/family education;Neuromuscular re-education;Manual techniques;Passive range of motion;Dry needling;Vasopneumatic Device;Taping    PT Next Visit Plan  MCD ERO    PT Home Exercise Plan  HSS, gastroc stretch, standing hip ext, standing hip abd, seated SLR, heel-toe gait, SLS    Consulted and Agree with Plan of Care  Patient       Patient will benefit from skilled therapeutic intervention in order to improve the following deficits and impairments:  Abnormal gait, Difficulty walking, Increased muscle spasms, Decreased activity tolerance, Pain, Improper body mechanics, Impaired flexibility, Decreased strength, Increased edema, Decreased balance  Visit Diagnosis: Acute pain of left knee  Muscle weakness (generalized)  Localized edema     Problem  List Patient Active Problem List   Diagnosis Date Noted  . Vitamin D deficiency 07/13/2017  . Closed fracture of nasal bones 06/10/2017  . 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 Migraine 12/04/2009    [name] PT, DPT 12/29/18 6:03 PM   [name] [address] [place], [place], [address] Phone: [phone]   Fax:  [phone]  Name: [name] MRN: [id number] Date of Birth: 1965/08/15

## 2018-12-29 NOTE — Telephone Encounter (Signed)
Spoke with patient- Reports she did not remember receiving a reminder for Tuesday appointment but plans to attend appointment this evening.  Erickson Yamashiro C. Maico Mulvehill PT, DPT 12/29/18 8:30 AM

## 2019-01-02 ENCOUNTER — Telehealth: Payer: Self-pay | Admitting: *Deleted

## 2019-01-02 ENCOUNTER — Encounter: Payer: Self-pay | Admitting: Family Medicine

## 2019-01-02 NOTE — Telephone Encounter (Signed)
Spoke with patient and she stated she had more questions about her Colonoscopy. Staff informed patient that she would have to call the provider that completed the procedure. Patient verbalized understanding and agreed with advise.

## 2019-01-05 ENCOUNTER — Encounter: Payer: Self-pay | Admitting: Physical Therapy

## 2019-01-05 ENCOUNTER — Other Ambulatory Visit: Payer: Self-pay

## 2019-01-05 ENCOUNTER — Ambulatory Visit: Payer: Medicaid Other | Admitting: Physical Therapy

## 2019-01-05 DIAGNOSIS — R600 Localized edema: Secondary | ICD-10-CM

## 2019-01-05 DIAGNOSIS — M6281 Muscle weakness (generalized): Secondary | ICD-10-CM

## 2019-01-05 DIAGNOSIS — M25562 Pain in left knee: Secondary | ICD-10-CM | POA: Diagnosis not present

## 2019-01-05 NOTE — Therapy (Signed)
[facility] [place], [place], [address] Phone: [phone]   Fax:  [phone]  Physical Therapy Treatment/ERO  Patient Details  Name: [name] MRN: [id number] Date of Birth: 1965/07/31 Referring Provider (PT): Ophelia Charter, MD   Encounter Date: 01/05/2019  PT End of Session - 01/05/19 1758    Visit Number  3    Authorization Type  MCD    PT Start Time  7017    PT Stop Time  1755    PT Time Calculation (min)  40 min    Activity Tolerance  Patient tolerated treatment well    Behavior During Therapy  [facility] for tasks assessed/performed       Past Medical History:  Diagnosis Date  . Allergy   . Anemia   . Anxiety   . Chlamydia   . Depression   . Hypertension   . Irritable bowel syndrome (IBS)     Past Surgical History:  Procedure Laterality Date  . CESAREAN SECTION     x2  . TONSILLECTOMY    . TUBAL LIGATION      There were no vitals filed for this visit.  Subjective Assessment - 01/05/19 1719    Subjective  Still get stiff when I sit for a long period of time. Still has pain but less swelling, I almost feel normal once I start walking.    Patient Stated Goals  decrease pain, climb stairs step over step, be on knees, exercise- home workouts (jumping jax, run, basketball)    Currently in Pain?  Yes    Pain Score  8     Pain Location  Knee    Pain Orientation  Left    Pain Descriptors / Indicators  Sore    Aggravating Factors   sitting still    Pain Relieving Factors  move around         [facility] PT Assessment - 01/05/19 0001      Assessment   Medical Diagnosis  Lt knee pain    Referring Provider (PT)  Ophelia Charter, MD    Onset Date/Surgical Date  10/21/18      Home Environment   Additional Comments  10+ steps to enter, lives on second story      Prior Function   Vocation Requirements  working from home right now, on computer      Strength   Left Hip Flexion  4/5    Left Hip Extension   5/5    Left Hip ABduction  4-/5    Left Knee Flexion  4+/5    Left Knee Extension  4+/5                   [facility] Adult PT Treatment/Exercise - 01/05/19 0001      Knee/Hip Exercises: Aerobic   Recumbent Bike  5 min L1      Knee/Hip Exercises: Supine   Bridges with Clamshell  15 reps      Knee/Hip Exercises: Sidelying   Clams  x20    Other Sidelying Knee/Hip Exercises  sidelying arcs      Knee/Hip Exercises: Prone   Hip Extension Limitations  with knee flexed             PT Education - 01/05/19 1803    Education Details  goals, HEP, POC , pain and fear    Person(s) Educated  Patient    Methods  Explanation;Demonstration;Tactile cues;Verbal cues;Handout    Comprehension  Verbalized understanding;Returned  demonstration;Verbal cues required;Tactile cues required;Need further instruction       PT Short Term Goals - 01/05/19 1725      PT SHORT TERM GOAL #1   Title  Pt will be independent in short term HEP    Status  Achieved      PT SHORT TERM GOAL #2   Title  Pt will demo proper heel/toe gait pattern for short distances without limping    Status  Achieved      PT SHORT TERM GOAL #3   Title  pt will demo proper step-over-step pattern on stairs    Status  Achieved        PT Long Term Goals - 01/05/19 1729      PT LONG TERM GOAL #1   Title  navigate stairs without pain    Baseline  proper pattern today but soreness    Time  6    Period  Weeks    Status  New      PT LONG TERM GOAL #2   Title  pt will be able to kneel on knee    Baseline  unable today    Time  6    Period  Weeks    Status  New      PT LONG TERM GOAL #3   Title  Pt will tolerate plyometric activities for exercise and play with son    Baseline  unable today    Status  New            Plan - 01/05/19 1758    Clinical Impression Statement  pt has met her short term goals and is ready for exercise progression. Pt is fearful of movement and, when encouraged, found that she  can do the exercises with challenge but not pain. Pt will continue to benefit from skilled PT in order to achieve long term functional goals.    Examination-Activity Limitations  Bathing;Locomotion Level;Bed Mobility;Caring for Others;Sit;Sleep;Squat;Dressing;Stairs;Stand;Hygiene/Grooming    Stability/Clinical Decision Making  Stable/Uncomplicated    PT Frequency  2x / week    PT Duration  6 weeks    PT Treatment/Interventions  ADLs/Self Care Home Management;Cryotherapy;Electrical Stimulation;Ultrasound;Moist Heat;Iontophoresis 49m/ml Dexamethasone;Gait training;Stair training;Functional mobility training;Therapeutic activities;Therapeutic exercise;Balance training;Patient/family education;Neuromuscular re-education;Manual techniques;Passive range of motion;Dry needling;Vasopneumatic Device;Taping    PT Next Visit Plan  did she try bike? progress hip strength    PT Home Exercise Plan  HSS, gastroc stretch, standing hip ext, standing hip abd, seated SLR, heel-toe gait, SLS; clam-hipabd-arcs, bridge with clam, prone quad stretch, prone hip ext    Consulted and Agree with Plan of Care  Patient       Patient will benefit from skilled therapeutic intervention in order to improve the following deficits and impairments:  Abnormal gait, Difficulty walking, Increased muscle spasms, Decreased activity tolerance, Pain, Improper body mechanics, Impaired flexibility, Decreased strength, Increased edema, Decreased balance  Visit Diagnosis: Acute pain of left knee  Muscle weakness (generalized)  Localized edema     Problem List Patient Active Problem List   Diagnosis Date Noted  . Vitamin D deficiency 07/13/2017  . Closed fracture of nasal bones 06/10/2017  . 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 Migraine 12/04/2009   Durell Lofaso C. Brendalyn Vallely PT, DPT 01/05/19 6:04 PM   [facility] [place] [address]Phone: [phone]  Fax:   [phone] Name: Monica PELTZMRN: [id number] of Birth: [birth year]

## 2019-01-17 ENCOUNTER — Encounter: Payer: Self-pay | Admitting: Physical Therapy

## 2019-01-17 ENCOUNTER — Other Ambulatory Visit: Payer: Self-pay

## 2019-01-17 ENCOUNTER — Ambulatory Visit: Payer: Medicaid Other | Admitting: Physical Therapy

## 2019-01-17 DIAGNOSIS — M25562 Pain in left knee: Secondary | ICD-10-CM

## 2019-01-17 DIAGNOSIS — R600 Localized edema: Secondary | ICD-10-CM

## 2019-01-17 DIAGNOSIS — M6281 Muscle weakness (generalized): Secondary | ICD-10-CM

## 2019-01-17 NOTE — Therapy (Signed)
[facility] [address] [place], [place], [address] Phone: [phone]   Fax:  [phone]  Physical Therapy Treatment  Patient Details  Name: [name] MRN: [id number] Date of Birth: [birth year] Referring Provider (PT): Ramond Marrow, MD   Encounter Date: 01/17/2019  PT End of Session - 01/17/19 1707    Visit Number  4    Authorization Type  MCD    PT Start Time  1628    PT Stop Time  1706    PT Time Calculation (min)  38 min    Activity Tolerance  Patient tolerated treatment well    Behavior During Therapy  [facility] for tasks assessed/performed       Past Medical History:  Diagnosis Date  . Allergy   . Anemia   . Anxiety   . Chlamydia   . Depression   . Hypertension   . Irritable bowel syndrome (IBS)     Past Surgical History:  Procedure Laterality Date  . CESAREAN SECTION     x2  . TONSILLECTOMY    . TUBAL LIGATION      There were no vitals filed for this visit.  Subjective Assessment - 01/17/19 1633    Subjective  Not really in pain, just when I move in a certain way. It is much better, bending it all the way back or put all my weight on my leg. I still get stiff.    Patient Stated Goals  decrease pain, climb stairs step over step, be on knees, exercise- home workouts (jumping jax, run, basketball)    Currently in Pain?  No/denies                       [facility] Adult PT Treatment/Exercise - 01/17/19 0001      Knee/Hip Exercises: Aerobic   Recumbent Bike  5 min L1      Knee/Hip Exercises: Machines for Strengthening   Cybex Knee Flexion  35lb x15, quick bend with slow extend      Knee/Hip Exercises: Standing   Forward Step Up Limitations  on to green therapad    Walking with Sports Cord  side stepping yellow tband    Other Standing Knee Exercises  fwd mini lunge onto green therapad    Other Standing Knee Exercises  stairs pattern               PT Short Term Goals - 01/05/19  1725      PT SHORT TERM GOAL #1   Title  Pt will be independent in short term HEP    Status  Achieved      PT SHORT TERM GOAL #2   Title  Pt will demo proper heel/toe gait pattern for short distances without limping    Status  Achieved      PT SHORT TERM GOAL #3   Title  pt will demo proper step-over-step pattern on stairs    Status  Achieved        PT Long Term Goals - 01/05/19 1729      PT LONG TERM GOAL #1   Title  navigate stairs without pain    Baseline  proper pattern today but soreness    Time  6    Period  Weeks    Status  New      PT LONG TERM GOAL #2   Title  pt will be able to kneel on knee    Baseline  unable  today    Time  6    Period  Weeks    Status  New      PT LONG TERM GOAL #3   Title  Pt will tolerate plyometric activities for exercise and play with son    Baseline  unable today    Status  New            Plan - 01/17/19 2029    Clinical Impression Statement  Pt is still fearful of new moevements but completed repetitions with encouragement. Able to perform steps without pain. Reports she wore braces as a child to reduce LE ER and tends to ER when placing foot on next step.    PT Treatment/Interventions  ADLs/Self Care Home Management;Cryotherapy;Electrical Stimulation;Ultrasound;Moist Heat;Iontophoresis 4mg /ml Dexamethasone;Gait training;Stair training;Functional mobility training;Therapeutic activities;Therapeutic exercise;Balance training;Patient/family education;Neuromuscular re-education;Manual techniques;Passive range of motion;Dry needling;Vasopneumatic Device;Taping    PT Next Visit Plan  leg press for gym at home, gastroc strengthening    PT Home Exercise Plan  HSS, gastroc stretch, standing hip ext, standing hip abd, seated SLR, heel-toe gait, SLS; clam-hipabd-arcs, bridge with clam, prone quad stretch, prone hip ext    Consulted and Agree with Plan of Care  Patient       Patient will benefit from skilled therapeutic intervention in  order to improve the following deficits and impairments:  Abnormal gait, Difficulty walking, Increased muscle spasms, Decreased activity tolerance, Pain, Improper body mechanics, Impaired flexibility, Decreased strength, Increased edema, Decreased balance  Visit Diagnosis: Muscle weakness (generalized)  Acute pain of left knee  Localized edema     Problem List Patient Active Problem List   Diagnosis Date Noted  . Vitamin D deficiency 07/13/2017  . Closed fracture of nasal bones 06/10/2017  . 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 Migraine 12/04/2009    Jonea Bukowski C. Zymire Turnbo PT, DPT 01/17/19 8:40 PM   [place], [place], [address] Phone: [phone]   Fax:  [phone]  Name: [name] MRN: [id number] Date of Birth: Dec 19, 1965

## 2019-01-25 ENCOUNTER — Other Ambulatory Visit: Payer: Self-pay

## 2019-01-25 ENCOUNTER — Encounter: Payer: Self-pay | Admitting: Physical Therapy

## 2019-01-25 ENCOUNTER — Ambulatory Visit: Payer: Medicaid Other | Admitting: Physical Therapy

## 2019-01-25 DIAGNOSIS — M25562 Pain in left knee: Secondary | ICD-10-CM | POA: Diagnosis not present

## 2019-01-25 DIAGNOSIS — R600 Localized edema: Secondary | ICD-10-CM

## 2019-01-25 DIAGNOSIS — M6281 Muscle weakness (generalized): Secondary | ICD-10-CM

## 2019-01-25 NOTE — Therapy (Addendum)
[place], [place], [address] Phone: [phone]   Fax:  [phone]  Physical Therapy Treatment/Discharge  Patient Details  Name: [name] MRN: [id number] Date of Birth: 1966/03/27 Referring Provider (PT): Ophelia Charter, MD   Encounter Date: 01/25/2019  PT End of Session - 01/25/19 1425    Visit Number  5    Authorization Type  MCD 10/19-11/29    Authorization - Visit Number  1    Authorization - Number of Visits  12    PT Start Time  5361    PT Stop Time  1458    PT Time Calculation (min)  41 min    Activity Tolerance  Patient tolerated treatment well    Behavior During Therapy  [facility] for tasks assessed/performed       Past Medical History:  Diagnosis Date  . Allergy   . Anemia   . Anxiety   . Chlamydia   . Depression   . Hypertension   . Irritable bowel syndrome (IBS)     Past Surgical History:  Procedure Laterality Date  . CESAREAN SECTION     x2  . TONSILLECTOMY    . TUBAL LIGATION      There were no vitals filed for this visit.  Subjective Assessment - 01/25/19 1425    Subjective  I was doing well on the stairs until the last 2 at the top. Feels looser. did not make it to the gym to ride the bike. feels stiff after I sit at my desk.    Patient Stated Goals  decrease pain, climb stairs step over step, be on knees, exercise- home workouts (jumping jax, run, basketball)    Currently in Pain?  No/denies                       [facility] Adult PT Treatment/Exercise - 01/25/19 0001      Knee/Hip Exercises: Stretches   Passive Hamstring Stretch Limitations  seated with strap    Piriformis Stretch Limitations  seated    Gastroc Stretch Limitations  slant board      Knee/Hip Exercises: Aerobic   Recumbent Bike  5 min L2      Knee/Hip Exercises: Machines for Strengthening   Cybex Leg Press  supine leg press 2 feet 2 plates, 1 foot 1 plate      Knee/Hip Exercises:  Standing   Heel Raises  20 reps;Both    SLS  on therapad 3x30s      Knee/Hip Exercises: Seated   Long Arc Quad  Both;10 reps    Long Arc Quad Limitations  ball bw knees      Knee/Hip Exercises: Sidelying   Other Sidelying Knee/Hip Exercises  large circles x10 each direction, both legs               PT Short Term Goals - 01/05/19 1725      PT SHORT TERM GOAL #1   Title  Pt will be independent in short term HEP    Status  Achieved      PT SHORT TERM GOAL #2   Title  Pt will demo proper heel/toe gait pattern for short distances without limping    Status  Achieved      PT SHORT TERM GOAL #3   Title  pt will demo proper step-over-step pattern on stairs    Status  Achieved        PT Long Term Goals -  01/05/19 1729      PT LONG TERM GOAL #1   Title  navigate stairs without pain    Baseline  proper pattern today but soreness    Time  6    Period  Weeks    Status  New      PT LONG TERM GOAL #2   Title  pt will be able to kneel on knee    Baseline  unable today    Time  6    Period  Weeks    Status  New      PT LONG TERM GOAL #3   Title  Pt will tolerate plyometric activities for exercise and play with son    Baseline  unable today    Status  New            Plan - 01/25/19 1458    Clinical Impression Statement  Pt slightly limited today due to dizziness/nausea, thinks she did not eat enough today. Advanced sidelying arcs to full circles for increased hip challenge. Was able to sit in figure 4 today without increased knee pain.    PT Treatment/Interventions  ADLs/Self Care Home Management;Cryotherapy;Electrical Stimulation;Ultrasound;Moist Heat;Iontophoresis 29m/ml Dexamethasone;Gait training;Stair training;Functional mobility training;Therapeutic activities;Therapeutic exercise;Balance training;Patient/family education;Neuromuscular re-education;Manual techniques;Passive range of motion;Dry needling;Vasopneumatic Device;Taping    PT Home Exercise Plan  HSS,  gastroc stretch, standing hip ext, standing hip abd, seated SLR, heel-toe gait, SLS; clam-hipabd-circles, bridge with clam, prone quad stretch, prone hip ext    Consulted and Agree with Plan of Care  Patient       Patient will benefit from skilled therapeutic intervention in order to improve the following deficits and impairments:  Abnormal gait, Difficulty walking, Increased muscle spasms, Decreased activity tolerance, Pain, Improper body mechanics, Impaired flexibility, Decreased strength, Increased edema, Decreased balance  Visit Diagnosis: Muscle weakness (generalized)  Acute pain of left knee  Localized edema     Problem List Patient Active Problem List   Diagnosis Date Noted  . Vitamin D deficiency 07/13/2017  . Closed fracture of nasal bones 06/10/2017  . 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 Migraine 12/04/2009    Maedell Hedger C. Prather Failla PT, DPT 01/25/19 3:00 PM   [facility] [address]Phone: [phone]  Fax:  [phone] Name: Monica PASSONMRN: [id number] of Birth: [birth year]PHYSICAL THERAPY DISCHARGE SUMMARY  Visits from Start of Care: 5  Current functional level related to goals / functional outcomes: See above   Remaining deficits: See above   Education / Equipment: Anatomy of condition, POC, HEP, exercise form/rationale  Plan: Patient agrees to discharge.  Patient goals were partially met. Patient is being discharged due to being pleased with the current functional level.  ?????  Pt called to request cancelling her appointments as she is doing well and would like to continue with HEP.   Cariah Salatino C. Kunaal Walkins PT, DPT 02/15/19 12:52 PM

## 2019-02-01 ENCOUNTER — Ambulatory Visit: Payer: Managed Care, Other (non HMO) | Admitting: Physical Therapy

## 2019-02-16 ENCOUNTER — Ambulatory Visit: Payer: Managed Care, Other (non HMO) | Admitting: Physical Therapy

## 2019-12-29 ENCOUNTER — Other Ambulatory Visit: Payer: Self-pay | Admitting: Family Medicine

## 2019-12-29 DIAGNOSIS — I10 Essential (primary) hypertension: Secondary | ICD-10-CM

## 2020-04-24 IMAGING — DX DG HIP (WITH OR WITHOUT PELVIS) 3-4V BILAT
5 series · 5 of 5 positions shown · non-contrast
Comparison: None.

CLINICAL DATA: Pt states she was standing on her bed and slipped
and fell landing on left hip on 10/21/18. States left knee twisted
and bent. Pain with ambulation in left knee. Previous left leg
injury in 4434. Best obtainable images due to pt condition.

EXAM:
DG HIP (WITH OR WITHOUT PELVIS) 3-4V BILAT

[pelvis ap]
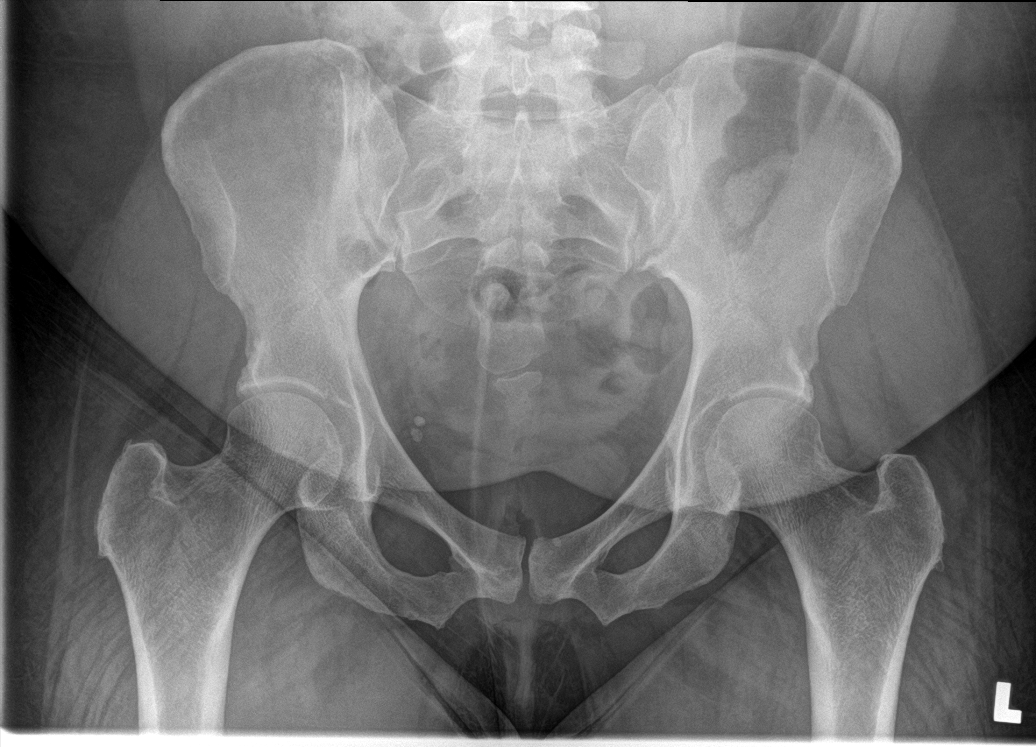

[hip ap (1 of 2)]
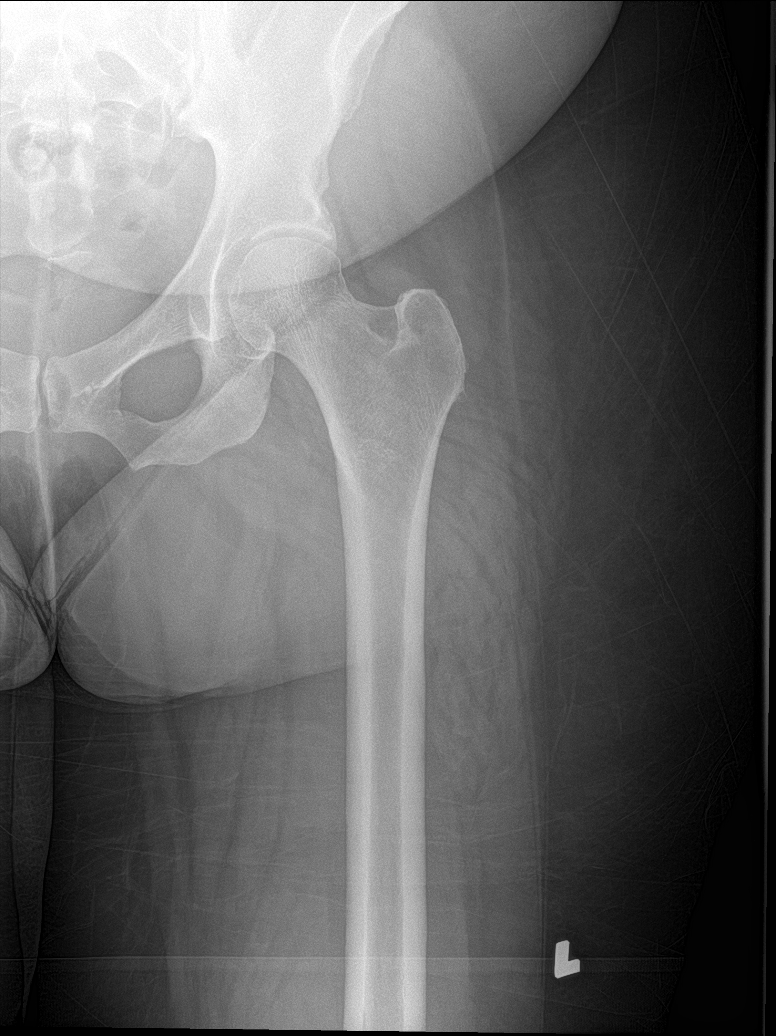

[hip lat (1 of 2)]
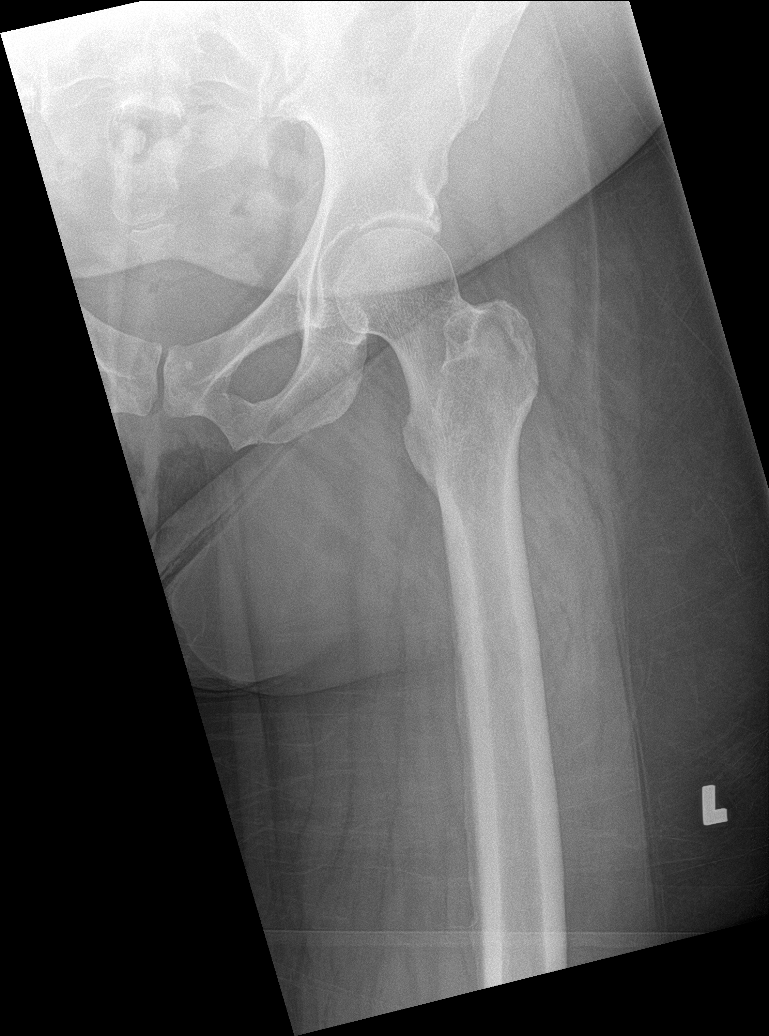

[hip ap (2 of 2)]
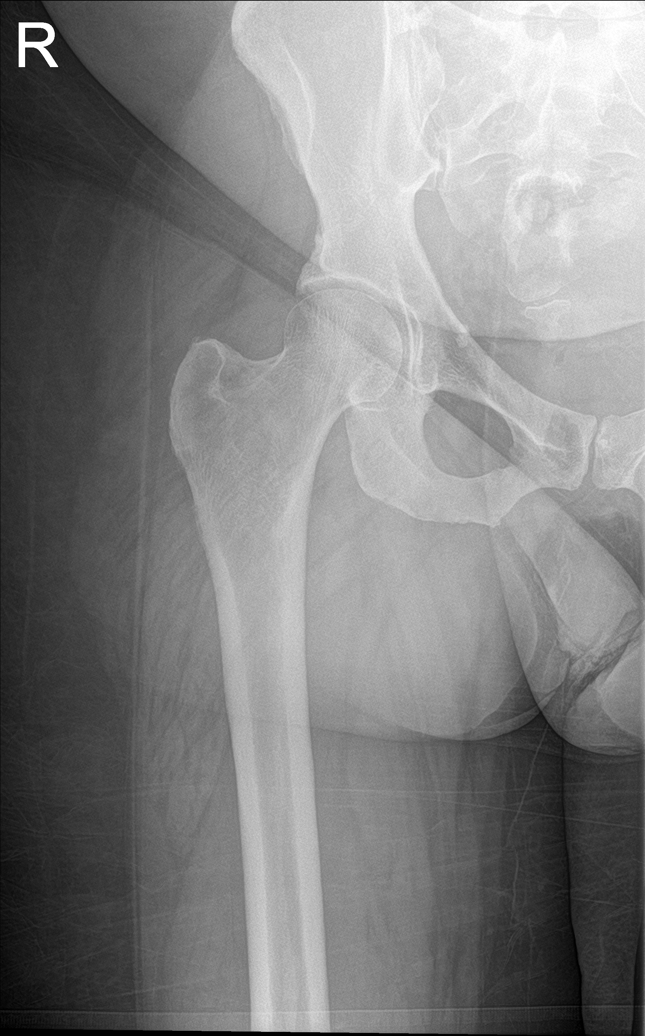

[hip lat (2 of 2)]
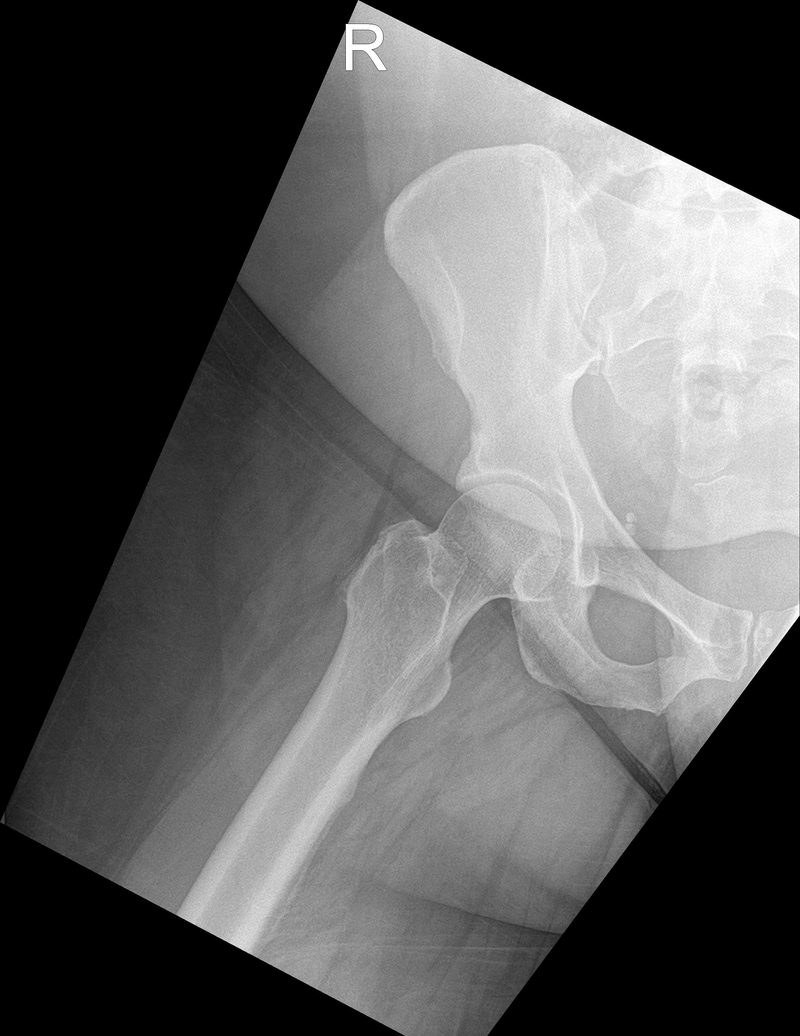

[5 of 5 positions shown; findings below may reference images not displayed]

FINDINGS: There is no evidence of hip fracture or dislocation. There is no
evidence of arthropathy or other focal bone abnormality.
IMPRESSION: Negative.

## 2020-04-24 IMAGING — DX LEFT KNEE - COMPLETE 4+ VIEW
4 series · 4 of 4 positions shown · non-contrast
Comparison: None

CLINICAL DATA: Pt states she was standing on her bed and slipped
and fell landing on left hip on 10/21/18. States left knee twisted
and bent. Pain with ambulation in left knee. Previous left leg
injury in 7358. Best obtainable images due to pt condition.

EXAM:
LEFT KNEE - COMPLETE 4+ VIEW

[knee ap]
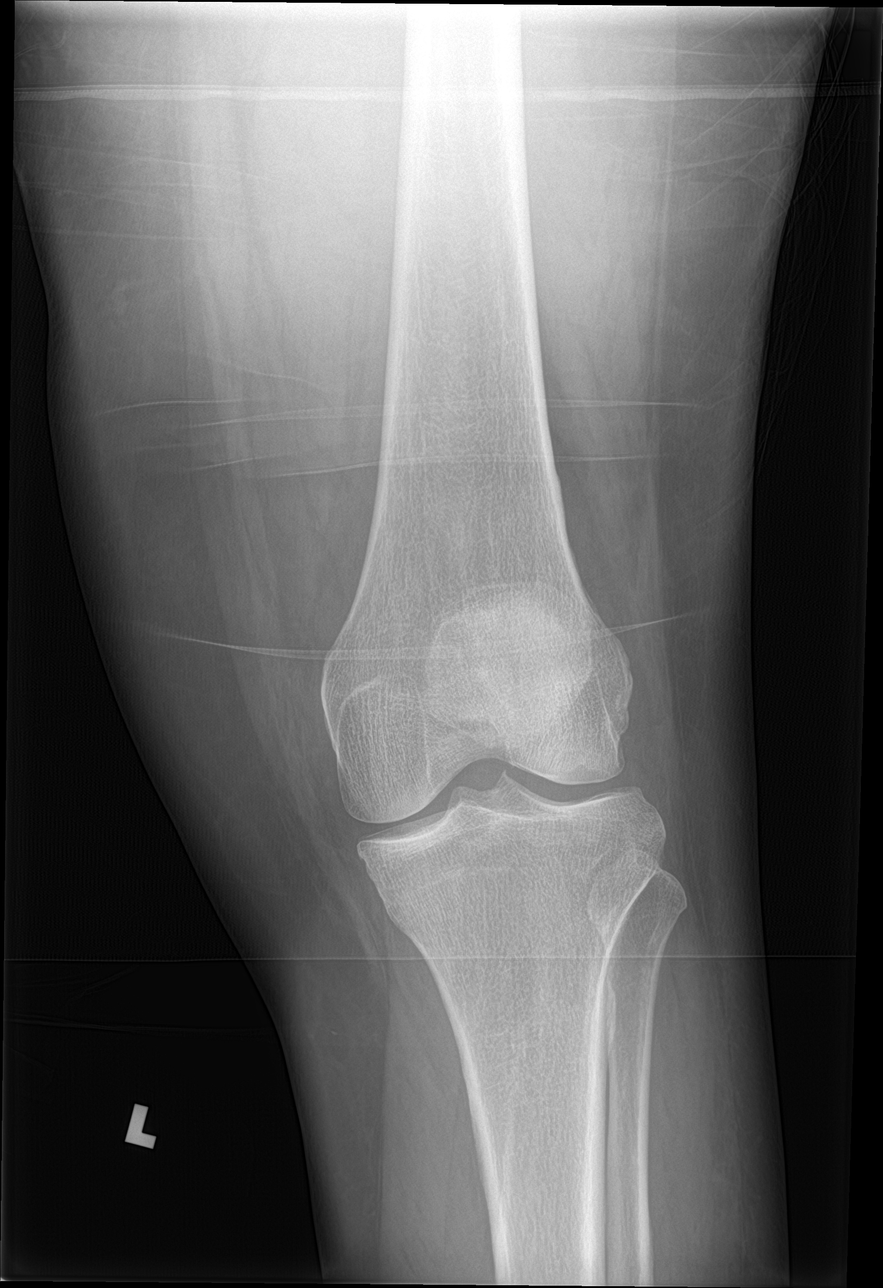

[knee lat]
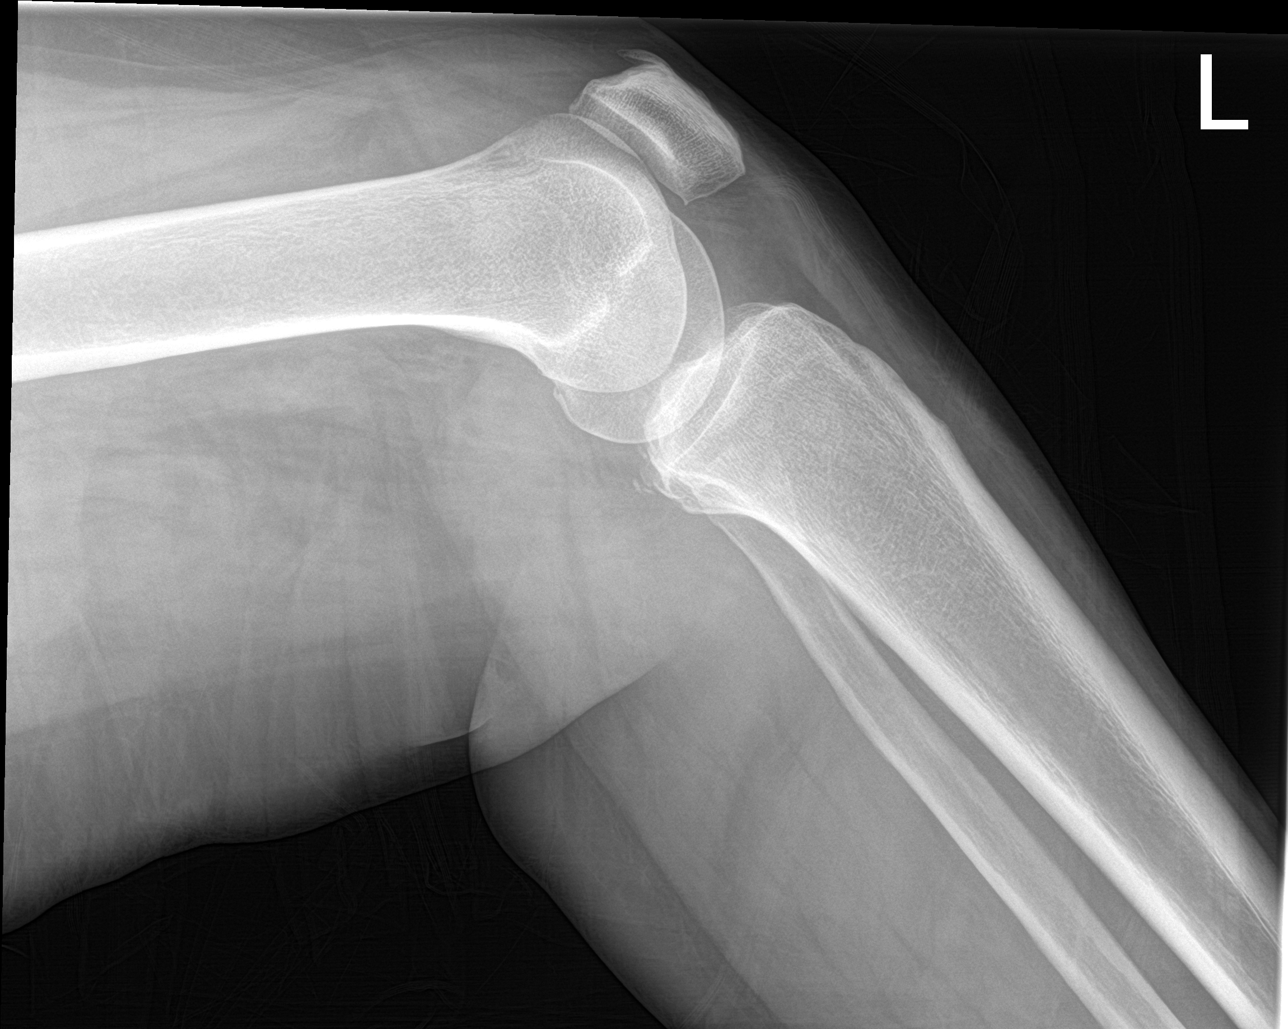

[knee obl (1 of 2)]
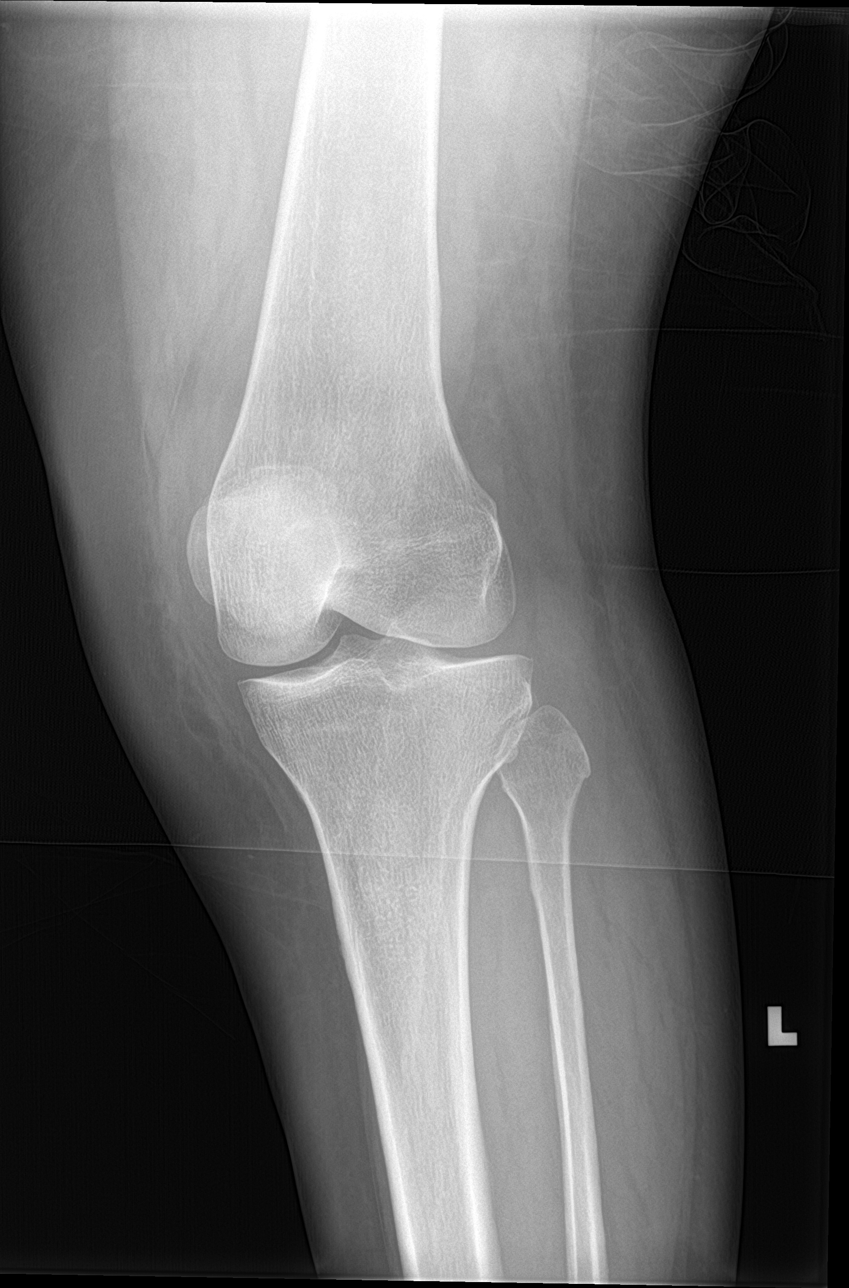

[knee obl (2 of 2)]
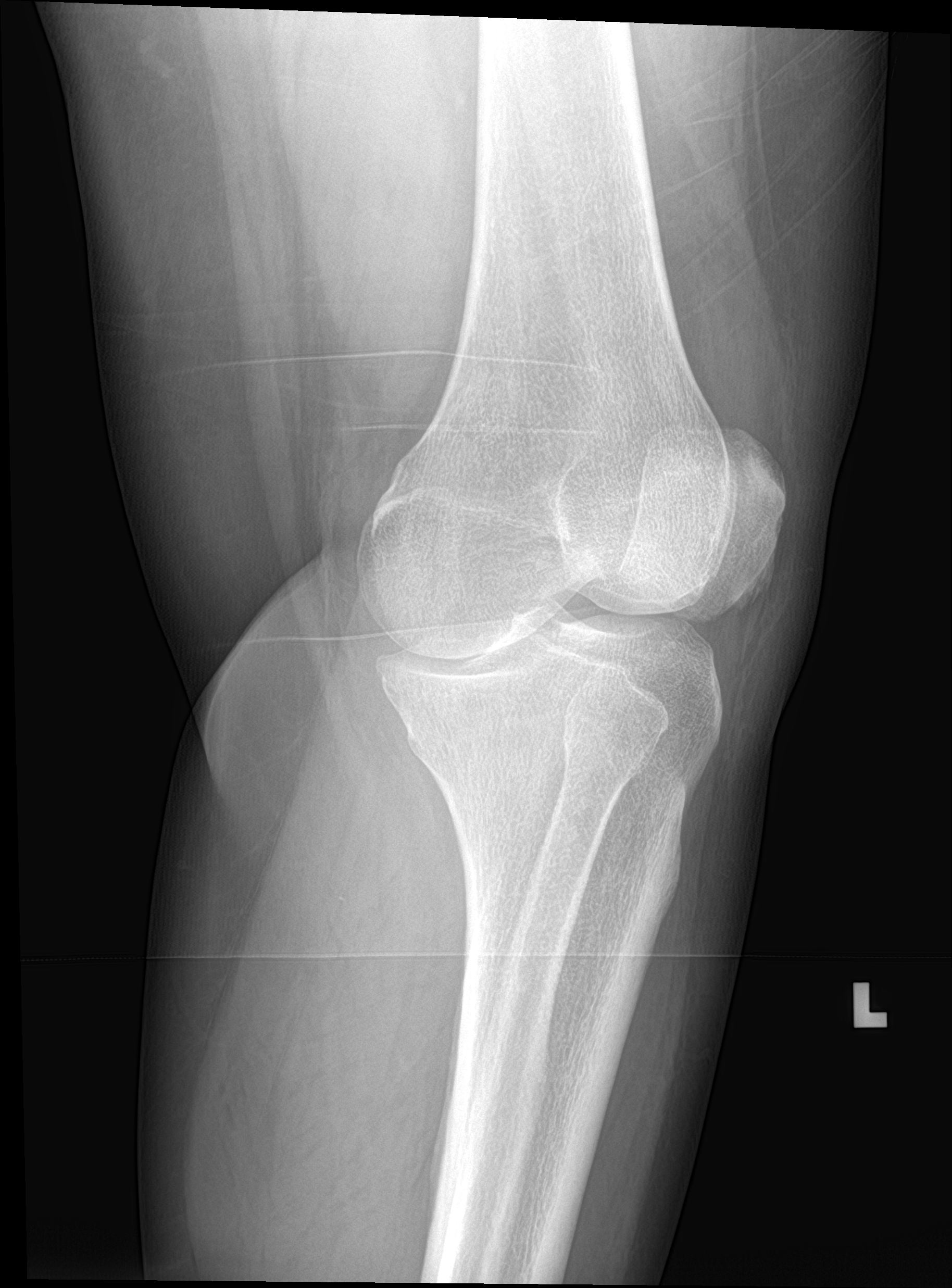

[4 of 4 positions shown; findings below may reference images not displayed]

FINDINGS: Mild degenerative changes are present at the patellofemoral
compartment. No acute fracture or subluxation. No joint effusion.
IMPRESSION: No evidence for acute abnormality.

## 2021-02-12 ENCOUNTER — Other Ambulatory Visit: Payer: Self-pay | Admitting: Obstetrics and Gynecology
